# Patient Record
Sex: Female | Born: 1960 | Race: White | Hispanic: No | Marital: Married | Smoking: Former smoker
Health system: Southern US, Community
[De-identification: ages and names within clinical notes are randomized; demographics above are authoritative.]

## PROBLEM LIST (undated history)

## (undated) DIAGNOSIS — E559 Vitamin D deficiency, unspecified: Secondary | ICD-10-CM

## (undated) DIAGNOSIS — I255 Ischemic cardiomyopathy: Secondary | ICD-10-CM

## (undated) DIAGNOSIS — I2510 Atherosclerotic heart disease of native coronary artery without angina pectoris: Secondary | ICD-10-CM

## (undated) DIAGNOSIS — E785 Hyperlipidemia, unspecified: Secondary | ICD-10-CM

## (undated) DIAGNOSIS — I213 ST elevation (STEMI) myocardial infarction of unspecified site: Secondary | ICD-10-CM

## (undated) DIAGNOSIS — I10 Essential (primary) hypertension: Secondary | ICD-10-CM

## (undated) HISTORY — DX: Vitamin D deficiency, unspecified: E55.9

## (undated) HISTORY — PX: CHOLECYSTECTOMY: SHX55

## (undated) HISTORY — DX: Atherosclerotic heart disease of native coronary artery without angina pectoris: I25.10

## (undated) HISTORY — PX: APPENDECTOMY: SHX54

---
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
## 1997-12-05 ENCOUNTER — Emergency Department (HOSPITAL_COMMUNITY): Admission: EM | Admit: 1997-12-05 | Discharge: 1997-12-05 | Payer: Self-pay | Admitting: Emergency Medicine

## 1997-12-09 ENCOUNTER — Ambulatory Visit (HOSPITAL_COMMUNITY): Admission: RE | Admit: 1997-12-09 | Discharge: 1997-12-10 | Payer: Self-pay | Admitting: General Surgery

## 1998-08-04 ENCOUNTER — Other Ambulatory Visit: Admission: RE | Admit: 1998-08-04 | Discharge: 1998-08-04 | Payer: Self-pay | Admitting: Gynecology

## 1998-08-21 ENCOUNTER — Ambulatory Visit (HOSPITAL_COMMUNITY): Admission: RE | Admit: 1998-08-21 | Discharge: 1998-08-21 | Payer: Self-pay | Admitting: Gynecology

## 2002-07-30 ENCOUNTER — Emergency Department (HOSPITAL_COMMUNITY): Admission: EM | Admit: 2002-07-30 | Discharge: 2002-07-30 | Payer: Self-pay | Admitting: Emergency Medicine

## 2005-07-20 ENCOUNTER — Ambulatory Visit (HOSPITAL_COMMUNITY): Admission: RE | Admit: 2005-07-20 | Discharge: 2005-07-20 | Payer: Self-pay | Admitting: Family Medicine

## 2005-12-04 ENCOUNTER — Ambulatory Visit (HOSPITAL_COMMUNITY): Admission: AD | Admit: 2005-12-04 | Discharge: 2005-12-04 | Payer: Self-pay | Admitting: Urology

## 2005-12-12 ENCOUNTER — Ambulatory Visit (HOSPITAL_COMMUNITY): Admission: RE | Admit: 2005-12-12 | Discharge: 2005-12-12 | Payer: Self-pay | Admitting: Urology

## 2006-09-18 ENCOUNTER — Ambulatory Visit (HOSPITAL_COMMUNITY): Admission: RE | Admit: 2006-09-18 | Discharge: 2006-09-18 | Payer: Self-pay | Admitting: Urology

## 2011-07-24 ENCOUNTER — Other Ambulatory Visit: Payer: Self-pay | Admitting: *Deleted

## 2011-07-24 DIAGNOSIS — Z1231 Encounter for screening mammogram for malignant neoplasm of breast: Secondary | ICD-10-CM

## 2011-08-14 ENCOUNTER — Ambulatory Visit: Payer: Self-pay

## 2011-08-28 ENCOUNTER — Ambulatory Visit
Admission: RE | Admit: 2011-08-28 | Discharge: 2011-08-28 | Disposition: A | Discharge status: Home / Self Care | Payer: Managed Care, Other (non HMO) | Source: Ambulatory Visit | Attending: *Deleted | Admitting: *Deleted

## 2011-08-28 DIAGNOSIS — Z1231 Encounter for screening mammogram for malignant neoplasm of breast: Secondary | ICD-10-CM

## 2016-11-20 ENCOUNTER — Encounter: Payer: Self-pay | Admitting: Gastroenterology

## 2016-12-13 ENCOUNTER — Encounter: Payer: Self-pay | Admitting: *Deleted

## 2016-12-19 ENCOUNTER — Encounter: Payer: Managed Care, Other (non HMO) | Admitting: Gastroenterology

## 2019-10-28 DIAGNOSIS — I219 Acute myocardial infarction, unspecified: Secondary | ICD-10-CM

## 2019-10-28 HISTORY — DX: Acute myocardial infarction, unspecified: I21.9

## 2019-11-07 ENCOUNTER — Inpatient Hospital Stay (HOSPITAL_COMMUNITY): Payer: 59

## 2019-11-07 ENCOUNTER — Encounter (HOSPITAL_COMMUNITY)
Admission: EM | Disposition: A | Discharge status: Home / Self Care | Payer: Self-pay | Source: Home / Self Care | Attending: Cardiovascular Disease

## 2019-11-07 ENCOUNTER — Inpatient Hospital Stay (HOSPITAL_COMMUNITY)
Admission: EM | Admit: 2019-11-07 | Discharge: 2019-11-09 | DRG: 247 | Disposition: A | Discharge status: Home / Self Care | Payer: 59 | Attending: Cardiovascular Disease | Admitting: Cardiovascular Disease

## 2019-11-07 ENCOUNTER — Emergency Department (HOSPITAL_COMMUNITY): Payer: 59

## 2019-11-07 ENCOUNTER — Encounter (HOSPITAL_COMMUNITY): Payer: Self-pay | Admitting: Emergency Medicine

## 2019-11-07 DIAGNOSIS — E782 Mixed hyperlipidemia: Secondary | ICD-10-CM

## 2019-11-07 DIAGNOSIS — Z882 Allergy status to sulfonamides status: Secondary | ICD-10-CM | POA: Diagnosis not present

## 2019-11-07 DIAGNOSIS — E669 Obesity, unspecified: Secondary | ICD-10-CM | POA: Diagnosis present

## 2019-11-07 DIAGNOSIS — I252 Old myocardial infarction: Secondary | ICD-10-CM | POA: Diagnosis present

## 2019-11-07 DIAGNOSIS — E785 Hyperlipidemia, unspecified: Secondary | ICD-10-CM | POA: Diagnosis present

## 2019-11-07 DIAGNOSIS — I219 Acute myocardial infarction, unspecified: Secondary | ICD-10-CM | POA: Diagnosis not present

## 2019-11-07 DIAGNOSIS — I2109 ST elevation (STEMI) myocardial infarction involving other coronary artery of anterior wall: Secondary | ICD-10-CM

## 2019-11-07 DIAGNOSIS — Z955 Presence of coronary angioplasty implant and graft: Secondary | ICD-10-CM

## 2019-11-07 DIAGNOSIS — Z87891 Personal history of nicotine dependence: Secondary | ICD-10-CM | POA: Diagnosis not present

## 2019-11-07 DIAGNOSIS — Z20822 Contact with and (suspected) exposure to covid-19: Secondary | ICD-10-CM | POA: Diagnosis present

## 2019-11-07 DIAGNOSIS — I213 ST elevation (STEMI) myocardial infarction of unspecified site: Secondary | ICD-10-CM | POA: Diagnosis present

## 2019-11-07 DIAGNOSIS — I2510 Atherosclerotic heart disease of native coronary artery without angina pectoris: Secondary | ICD-10-CM | POA: Diagnosis present

## 2019-11-07 DIAGNOSIS — I10 Essential (primary) hypertension: Secondary | ICD-10-CM | POA: Diagnosis present

## 2019-11-07 DIAGNOSIS — Z6833 Body mass index (BMI) 33.0-33.9, adult: Secondary | ICD-10-CM

## 2019-11-07 DIAGNOSIS — Z888 Allergy status to other drugs, medicaments and biological substances status: Secondary | ICD-10-CM

## 2019-11-07 DIAGNOSIS — I2102 ST elevation (STEMI) myocardial infarction involving left anterior descending coronary artery: Secondary | ICD-10-CM | POA: Diagnosis not present

## 2019-11-07 DIAGNOSIS — I2582 Chronic total occlusion of coronary artery: Secondary | ICD-10-CM | POA: Diagnosis present

## 2019-11-07 DIAGNOSIS — I255 Ischemic cardiomyopathy: Secondary | ICD-10-CM | POA: Diagnosis present

## 2019-11-07 HISTORY — PX: LEFT HEART CATH AND CORONARY ANGIOGRAPHY: CATH118249

## 2019-11-07 HISTORY — DX: Ischemic cardiomyopathy: I25.5

## 2019-11-07 HISTORY — DX: Hyperlipidemia, unspecified: E78.5

## 2019-11-07 HISTORY — PX: CORONARY/GRAFT ACUTE MI REVASCULARIZATION: CATH118305

## 2019-11-07 HISTORY — DX: Essential (primary) hypertension: I10

## 2019-11-07 LAB — CBC WITH DIFFERENTIAL/PLATELET
Abs Immature Granulocytes: 0.02 10*3/uL (ref 0.00–0.07)
Basophils Absolute: 0.1 10*3/uL (ref 0.0–0.1)
Basophils Relative: 1 %
Eosinophils Absolute: 0.4 10*3/uL (ref 0.0–0.5)
Eosinophils Relative: 4 %
HCT: 46.4 % — ABNORMAL HIGH (ref 36.0–46.0)
Hemoglobin: 15.4 g/dL — ABNORMAL HIGH (ref 12.0–15.0)
Immature Granulocytes: 0 %
Lymphocytes Relative: 31 %
Lymphs Abs: 2.9 10*3/uL (ref 0.7–4.0)
MCH: 30.7 pg (ref 26.0–34.0)
MCHC: 33.2 g/dL (ref 30.0–36.0)
MCV: 92.6 fL (ref 80.0–100.0)
Monocytes Absolute: 0.5 10*3/uL (ref 0.1–1.0)
Monocytes Relative: 5 %
Neutro Abs: 5.5 10*3/uL (ref 1.7–7.7)
Neutrophils Relative %: 59 %
Platelets: 307 10*3/uL (ref 150–400)
RBC: 5.01 MIL/uL (ref 3.87–5.11)
RDW: 13.1 % (ref 11.5–15.5)
WBC: 9.3 10*3/uL (ref 4.0–10.5)
nRBC: 0 % (ref 0.0–0.2)

## 2019-11-07 LAB — COMPREHENSIVE METABOLIC PANEL
ALT: 27 U/L (ref 0–44)
AST: 23 U/L (ref 15–41)
Albumin: 3.8 g/dL (ref 3.5–5.0)
Alkaline Phosphatase: 89 U/L (ref 38–126)
Anion gap: 11 (ref 5–15)
BUN: 13 mg/dL (ref 6–20)
CO2: 20 mmol/L — ABNORMAL LOW (ref 22–32)
Calcium: 9.7 mg/dL (ref 8.9–10.3)
Chloride: 108 mmol/L (ref 98–111)
Creatinine, Ser: 0.75 mg/dL (ref 0.44–1.00)
GFR calc Af Amer: >60 mL/min (ref >60)
GFR calc non Af Amer: >60 mL/min (ref >60)
Glucose, Bld: 133 mg/dL — ABNORMAL HIGH (ref 70–99)
Potassium: 3.7 mmol/L (ref 3.5–5.1)
Sodium: 139 mmol/L (ref 135–145)
Total Bilirubin: 0.3 mg/dL (ref 0.3–1.2)
Total Protein: 7.1 g/dL (ref 6.5–8.1)

## 2019-11-07 LAB — I-STAT CHEM 8, ED
BUN: 15 mg/dL (ref 6–20)
Calcium, Ion: 1.17 mmol/L (ref 1.15–1.40)
Chloride: 108 mmol/L (ref 98–111)
Creatinine, Ser: 0.8 mg/dL (ref 0.44–1.00)
Glucose, Bld: 130 mg/dL — ABNORMAL HIGH (ref 70–99)
HCT: 43 % (ref 36.0–46.0)
Hemoglobin: 14.6 g/dL (ref 12.0–15.0)
Potassium: 3.5 mmol/L (ref 3.5–5.1)
Sodium: 140 mmol/L (ref 135–145)
TCO2: 23 mmol/L (ref 22–32)

## 2019-11-07 LAB — LIPID PANEL
Cholesterol: 287 mg/dL — ABNORMAL HIGH (ref 0–200)
HDL: 50 mg/dL (ref >40)
LDL Cholesterol: 201 mg/dL — ABNORMAL HIGH (ref 0–99)
Total CHOL/HDL Ratio: 5.7 RATIO
Triglycerides: 178 mg/dL — ABNORMAL HIGH (ref <150)
VLDL: 36 mg/dL (ref 0–40)

## 2019-11-07 LAB — ECHOCARDIOGRAM COMPLETE
Height: 63 in
Weight: 3185.21 oz

## 2019-11-07 LAB — RESPIRATORY PANEL BY RT PCR (FLU A&B, COVID)
Influenza A by PCR: NEGATIVE
Influenza B by PCR: NEGATIVE
SARS Coronavirus 2 by RT PCR: NEGATIVE

## 2019-11-07 LAB — PROTIME-INR
INR: 0.9 (ref 0.8–1.2)
Prothrombin Time: 12 seconds (ref 11.4–15.2)

## 2019-11-07 LAB — MRSA PCR SCREENING: MRSA by PCR: NEGATIVE

## 2019-11-07 LAB — APTT: aPTT: 28 seconds (ref 24–36)

## 2019-11-07 LAB — TROPONIN I (HIGH SENSITIVITY)
Troponin I (High Sensitivity): 157 ng/L (ref <18)
Troponin I (High Sensitivity): 12019 ng/L (ref <18)

## 2019-11-07 SURGERY — CORONARY/GRAFT ACUTE MI REVASCULARIZATION
Anesthesia: LOCAL

## 2019-11-07 MED ORDER — HEPARIN SODIUM (PORCINE) 5000 UNIT/ML IJ SOLN: 4000.0000 [IU] | Freq: Once | INTRAMUSCULAR | Status: DC

## 2019-11-07 MED ORDER — HEPARIN (PORCINE) IN NACL 1000-0.9 UT/500ML-% IV SOLN
INTRAVENOUS | Status: AC
  Filled 2019-11-07: qty 1000

## 2019-11-07 MED ORDER — SODIUM CHLORIDE 0.9 % IV SOLN: 250.0000 mL | INTRAVENOUS | Status: DC | PRN

## 2019-11-07 MED ORDER — DIAZEPAM 5 MG PO TABS: 5.0000 mg | ORAL_TABLET | Freq: Three times a day (TID) | ORAL | Status: DC | PRN

## 2019-11-07 MED ORDER — HEPARIN (PORCINE) IN NACL 1000-0.9 UT/500ML-% IV SOLN
INTRAVENOUS | Status: DC | PRN
  Administered 2019-11-07 (×2): 500 mL

## 2019-11-07 MED ORDER — ALPRAZOLAM 0.5 MG PO TABS
0.5000 mg | ORAL_TABLET | Freq: Three times a day (TID) | ORAL | Status: DC | PRN
  Administered 2019-11-07 – 2019-11-08 (×2): 0.5 mg via ORAL
  Filled 2019-11-07 (×2): qty 1

## 2019-11-07 MED ORDER — NITROGLYCERIN IN D5W 200-5 MCG/ML-% IV SOLN
INTRAVENOUS | Status: AC
  Administered 2019-11-07: 50 mg
  Filled 2019-11-07: qty 250

## 2019-11-07 MED ORDER — MIDAZOLAM HCL 2 MG/2ML IJ SOLN
INTRAMUSCULAR | Status: DC | PRN
  Administered 2019-11-07: 2 mg via INTRAVENOUS
  Administered 2019-11-07: 1 mg via INTRAVENOUS

## 2019-11-07 MED ORDER — FENTANYL CITRATE (PF) 100 MCG/2ML IJ SOLN
INTRAMUSCULAR | Status: AC
  Filled 2019-11-07: qty 2

## 2019-11-07 MED ORDER — ONDANSETRON HCL 4 MG/2ML IJ SOLN
4.0000 mg | Freq: Four times a day (QID) | INTRAMUSCULAR | Status: DC | PRN
  Administered 2019-11-07 (×2): 4 mg via INTRAVENOUS
  Filled 2019-11-07: qty 2

## 2019-11-07 MED ORDER — ONDANSETRON HCL 4 MG/2ML IJ SOLN
4.0000 mg | Freq: Four times a day (QID) | INTRAMUSCULAR | Status: DC | PRN
  Filled 2019-11-07: qty 2

## 2019-11-07 MED ORDER — ATORVASTATIN CALCIUM 80 MG PO TABS
80.0000 mg | ORAL_TABLET | Freq: Every day | ORAL | Status: DC
  Administered 2019-11-07 – 2019-11-09 (×3): 80 mg via ORAL
  Filled 2019-11-07 (×4): qty 1

## 2019-11-07 MED ORDER — SODIUM CHLORIDE 0.9 % IV SOLN: INTRAVENOUS | Status: DC

## 2019-11-07 MED ORDER — SODIUM CHLORIDE 0.9% FLUSH: 3.0000 mL | INTRAVENOUS | Status: DC | PRN

## 2019-11-07 MED ORDER — NITROGLYCERIN IN D5W 200-5 MCG/ML-% IV SOLN: 0.0000 ug/min | INTRAVENOUS | Status: DC

## 2019-11-07 MED ORDER — TICAGRELOR 90 MG PO TABS
ORAL_TABLET | ORAL | Status: AC
  Filled 2019-11-07: qty 2

## 2019-11-07 MED ORDER — NITROGLYCERIN 1 MG/10 ML FOR IR/CATH LAB
INTRA_ARTERIAL | Status: DC | PRN
  Administered 2019-11-07 (×2): 200 ug via INTRACORONARY

## 2019-11-07 MED ORDER — NITROGLYCERIN 0.4 MG SL SUBL
0.4000 mg | SUBLINGUAL_TABLET | SUBLINGUAL | Status: DC | PRN
  Filled 2019-11-07: qty 1

## 2019-11-07 MED ORDER — HEPARIN SODIUM (PORCINE) 1000 UNIT/ML IJ SOLN
INTRAMUSCULAR | Status: DC | PRN
  Administered 2019-11-07 (×2): 4000 [IU] via INTRAVENOUS

## 2019-11-07 MED ORDER — CHLORHEXIDINE GLUCONATE CLOTH 2 % EX PADS
6.0000 | MEDICATED_PAD | Freq: Every day | CUTANEOUS | Status: DC
  Administered 2019-11-07 – 2019-11-08 (×2): 6 via TOPICAL

## 2019-11-07 MED ORDER — FUROSEMIDE 10 MG/ML IJ SOLN
40.0000 mg | Freq: Once | INTRAMUSCULAR | Status: AC
  Administered 2019-11-07: 40 mg via INTRAVENOUS
  Filled 2019-11-07: qty 4

## 2019-11-07 MED ORDER — HEPARIN SODIUM (PORCINE) 1000 UNIT/ML IJ SOLN
INTRAMUSCULAR | Status: AC
  Filled 2019-11-07: qty 1

## 2019-11-07 MED ORDER — LOSARTAN POTASSIUM 50 MG PO TABS
50.0000 mg | ORAL_TABLET | Freq: Every day | ORAL | Status: DC
  Administered 2019-11-07: 50 mg via ORAL
  Filled 2019-11-07 (×2): qty 1

## 2019-11-07 MED ORDER — FUROSEMIDE 40 MG PO TABS
40.0000 mg | ORAL_TABLET | Freq: Every day | ORAL | Status: DC
  Filled 2019-11-07: qty 1

## 2019-11-07 MED ORDER — FENTANYL CITRATE (PF) 100 MCG/2ML IJ SOLN
INTRAMUSCULAR | Status: DC | PRN
  Administered 2019-11-07: 50 ug via INTRAVENOUS
  Administered 2019-11-07 (×2): 25 ug via INTRAVENOUS

## 2019-11-07 MED ORDER — ASPIRIN EC 81 MG PO TBEC
81.0000 mg | DELAYED_RELEASE_TABLET | Freq: Every day | ORAL | Status: DC
  Administered 2019-11-08 – 2019-11-09 (×2): 81 mg via ORAL
  Filled 2019-11-07 (×4): qty 1

## 2019-11-07 MED ORDER — TICAGRELOR 90 MG PO TABS
ORAL_TABLET | ORAL | Status: DC | PRN
  Administered 2019-11-07: 180 mg via ORAL

## 2019-11-07 MED ORDER — FENTANYL CITRATE (PF) 100 MCG/2ML IJ SOLN: 25.0000 ug | INTRAMUSCULAR | Status: DC | PRN

## 2019-11-07 MED ORDER — VERAPAMIL HCL 2.5 MG/ML IV SOLN
INTRAVENOUS | Status: DC | PRN
  Administered 2019-11-07: 05:00:00 10 mL via INTRA_ARTERIAL

## 2019-11-07 MED ORDER — NITROGLYCERIN 1 MG/10 ML FOR IR/CATH LAB
INTRA_ARTERIAL | Status: AC
  Filled 2019-11-07: qty 10

## 2019-11-07 MED ORDER — ACETAMINOPHEN 325 MG PO TABS
650.0000 mg | ORAL_TABLET | ORAL | Status: DC | PRN
  Administered 2019-11-07 – 2019-11-08 (×2): 650 mg via ORAL
  Filled 2019-11-07 (×2): qty 2

## 2019-11-07 MED ORDER — LIDOCAINE HCL (PF) 1 % IJ SOLN
INTRAMUSCULAR | Status: AC
  Filled 2019-11-07: qty 30

## 2019-11-07 MED ORDER — ASPIRIN 81 MG PO CHEW: 324.0000 mg | CHEWABLE_TABLET | Freq: Once | ORAL | Status: DC

## 2019-11-07 MED ORDER — SODIUM CHLORIDE 0.9% FLUSH
3.0000 mL | Freq: Two times a day (BID) | INTRAVENOUS | Status: DC
  Administered 2019-11-08 – 2019-11-09 (×3): 3 mL via INTRAVENOUS

## 2019-11-07 MED ORDER — VERAPAMIL HCL 2.5 MG/ML IV SOLN
INTRAVENOUS | Status: AC
  Filled 2019-11-07: qty 2

## 2019-11-07 MED ORDER — ENOXAPARIN SODIUM 40 MG/0.4ML ~~LOC~~ SOLN
40.0000 mg | SUBCUTANEOUS | Status: DC
  Administered 2019-11-07 – 2019-11-09 (×3): 40 mg via SUBCUTANEOUS
  Filled 2019-11-07 (×3): qty 0.4

## 2019-11-07 MED ORDER — LIDOCAINE HCL (PF) 1 % IJ SOLN
INTRAMUSCULAR | Status: DC | PRN
  Administered 2019-11-07: 2 mL via INTRADERMAL

## 2019-11-07 MED ORDER — IOHEXOL 350 MG/ML SOLN
INTRAVENOUS | Status: DC | PRN
  Administered 2019-11-07: 135 mL

## 2019-11-07 MED ORDER — TICAGRELOR 90 MG PO TABS
90.0000 mg | ORAL_TABLET | Freq: Two times a day (BID) | ORAL | Status: DC
  Administered 2019-11-07 – 2019-11-09 (×4): 90 mg via ORAL
  Filled 2019-11-07 (×4): qty 1

## 2019-11-07 MED ORDER — MIDAZOLAM HCL 2 MG/2ML IJ SOLN
INTRAMUSCULAR | Status: AC
  Filled 2019-11-07: qty 2

## 2019-11-07 MED ORDER — LABETALOL HCL 5 MG/ML IV SOLN: 10.0000 mg | INTRAVENOUS | Status: AC | PRN

## 2019-11-07 MED ORDER — SODIUM CHLORIDE 0.9 % WEIGHT BASED INFUSION
1.0000 mL/kg/h | INTRAVENOUS | Status: AC
  Administered 2019-11-07: 1 mL/kg/h via INTRAVENOUS

## 2019-11-07 MED ORDER — METOPROLOL TARTRATE 12.5 MG HALF TABLET
12.5000 mg | ORAL_TABLET | Freq: Two times a day (BID) | ORAL | Status: DC
  Administered 2019-11-07 (×2): 12.5 mg via ORAL
  Filled 2019-11-07 (×3): qty 1

## 2019-11-07 MED ORDER — HYDRALAZINE HCL 20 MG/ML IJ SOLN: 10.0000 mg | INTRAMUSCULAR | Status: AC | PRN

## 2019-11-07 SURGICAL SUPPLY — 17 items
BALLN SAPPHIRE 2.0X15 (BALLOONS) ×2
BALLN SAPPHIRE ~~LOC~~ 2.75X18 (BALLOONS) ×1 IMPLANT
BALLOON SAPPHIRE 2.0X15 (BALLOONS) IMPLANT
CATH 5FR JL3.5 JR4 ANG PIG MP (CATHETERS) ×1 IMPLANT
CATH LAUNCHER 5F EBU3.0 (CATHETERS) IMPLANT
CATHETER LAUNCHER 5F EBU3.0 (CATHETERS) ×2
DEVICE RAD COMP TR BAND LRG (VASCULAR PRODUCTS) ×1 IMPLANT
GLIDESHEATH SLEND SS 6F .021 (SHEATH) ×2 IMPLANT
GUIDEWIRE INQWIRE 1.5J.035X260 (WIRE) ×1 IMPLANT
INQWIRE 1.5J .035X260CM (WIRE) ×2
KIT ENCORE 26 ADVANTAGE (KITS) ×2 IMPLANT
KIT HEART LEFT (KITS) ×2 IMPLANT
PACK CARDIAC CATHETERIZATION (CUSTOM PROCEDURE TRAY) ×2 IMPLANT
STENT RESOLUTE ONYX 2.75X34 (Permanent Stent) ×1 IMPLANT
TRANSDUCER W/STOPCOCK (MISCELLANEOUS) ×2 IMPLANT
TUBING CIL FLEX 10 FLL-RA (TUBING) ×2 IMPLANT
WIRE COUGAR XT STRL 190CM (WIRE) ×1 IMPLANT

## 2019-11-07 NOTE — Progress Notes (Signed)
Progress Note  Patient Name: Rebecca Sanford Date of Encounter: 11/07/2019  Primary Cardiologist: New, Lenka Zhao   Subjective   Rebecca Sanford is a 59 year old female who presented with an acute anterior wall ST segment elevation myocardial infarction yesterday.  She was also found to have an occluded mid right coronary artery with collateral filling from left to right collaterals.  She is status post successful stenting of her mid LAD.  This morning she is continued to have some of that deep epigastric pain. She has a history of hyperlipidemia.  Her labs this morning reveal a total cholesterol of 287.  Her LDL is 201.  The HDL is 50.  The triglyceride level is 178.  She is very emotional over the recent  death of her mother .  She was cleaning out her mothers house when the CP started.   Inpatient Medications    Scheduled Meds:  [START ON 11/08/2019] aspirin EC  81 mg Oral Daily   atorvastatin  80 mg Oral q1800   metoprolol tartrate  12.5 mg Oral BID   sodium chloride flush  3 mL Intravenous Q12H   ticagrelor  90 mg Oral BID   Continuous Infusions:  sodium chloride     sodium chloride     sodium chloride 1 mL/kg/hr (11/07/19 0649)   PRN Meds: sodium chloride, acetaminophen, diazepam, fentaNYL (SUBLIMAZE) injection, hydrALAZINE, labetalol, nitroGLYCERIN, ondansetron (ZOFRAN) IV, ondansetron (ZOFRAN) IV, sodium chloride flush   Vital Signs    Vitals:   11/07/19 0538 11/07/19 0543 11/07/19 0615 11/07/19 0630  BP: 131/89 (!) 136/93 122/86 129/86  Pulse: 95 80 83 77  Resp: 20 20 (!) 24 20  Temp:   97.8 F (36.6 C)   TempSrc:   Oral   SpO2: (!) 89% 96% 93% 96%  Weight:   90.3 kg   Height:   5\' 3"  (1.6 m)    No intake or output data in the 24 hours ending 11/07/19 0923 Last 3 Weights 11/07/2019 11/07/2019  Weight (lbs) 199 lb 1.2 oz 190 lb 4.1 oz  Weight (kg) 90.3 kg 86.3 kg      Telemetry    NSR  - Personally Reviewed  ECG     - Personally Reviewed  Physical  Exam   GEN:  Middle-aged female, moderately obese, no acute distress Neck: No JVD Cardiac: RRR, no murmurs, rubs, or gallops.  Respiratory: Clear to auscultation bilaterally. GI: Soft, nontender, non-distended  MS: No edema; No deformity.  Right radial cath site looks good. Neuro:  Nonfocal  Psych: Normal affect   Labs    High Sensitivity Troponin:   Recent Labs  Lab 11/07/19 0412 11/07/19 0623  TROPONINIHS 157* 12,019*      Chemistry Recent Labs  Lab 11/07/19 0412 11/07/19 0423  NA 139 140  K 3.7 3.5  CL 108 108  CO2 20*  --   GLUCOSE 133* 130*  BUN 13 15  CREATININE 0.75 0.80  CALCIUM 9.7  --   PROT 7.1  --   ALBUMIN 3.8  --   AST 23  --   ALT 27  --   ALKPHOS 89  --   BILITOT 0.3  --   GFRNONAA >60  --   GFRAA >60  --   ANIONGAP 11  --      Hematology Recent Labs  Lab 11/07/19 0412 11/07/19 0423  WBC 9.3  --   RBC 5.01  --   HGB 15.4* 14.6  HCT 46.4* 43.0  MCV  92.6  --   MCH 30.7  --   MCHC 33.2  --   RDW 13.1  --   PLT 307  --     BNPNo results for input(s): BNP, PROBNP in the last 168 hours.   DDimer No results for input(s): DDIMER in the last 168 hours.   Radiology    CARDIAC CATHETERIZATION  Result Date: 11/07/2019 1.  Severe mid LAD stenosis, tandem lesions treated with a single 2.75 x 34 mm resolute Onyx DES 2.  Moderate left circumflex stenosis, does not appear flow-limiting 3.  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  Severe segmental LV dysfunction with akinesis of the anterolateral wall and entire periapical region including the inferoapex, LVEF estimated at 30 to 35% Recommendations: Guideline directed medical therapy, dual antiplatelet therapy with aspirin and ticagrelor at least 12 months without interruption.  The patient's left ventriculogram could be consistent with acute Takotsubo syndrome as well.  However, with the patient's EKG changes and critical stenosis of the LAD, favor STEMI involving the LAD.   We will trend cardiac markers.  DG Chest Port 1 View  Result Date: 11/07/2019 CLINICAL DATA:  Reflux.  Chest pain EXAM: PORTABLE CHEST 1 VIEW COMPARISON:  None. FINDINGS: Normal cardiac silhouette. Mild linear atelectasis lung bases. No effusion, infiltrate pneumothorax. No acute osseous abnormality. IMPRESSION: Mild basilar atelectasis. Electronically Signed   By: Suzy Bouchard M.D.   On: 11/07/2019 04:45    Cardiac Studies     Patient Profile     59 y.o. female with hyperlipidemia and coronary artery disease.  She presented with an anterior wall myocardial infarction.  Assessment & Plan    1.  Acute anterior wall myocardial infarction: She presented with a critical stenosis of the mid LAD.  She is status post stenting of her LAD.  The heart catheterization revealed that she is also had an occlusion of her mid RCA.  She has good collateral filling of the distal RCA from the left circumflex and LAD via collaterals. We will work on risk factor modification including cholesterol reduction, improvement of her diet and starting an exercise program.  She still having some chest pain today.  She was scheduled to transfer out of the intensive care unit but I think she needs to stay in the unit for today.  2.  Hyperlipidemia: Continue atorvastatin 80 mg a day.   For questions or updates, please contact [place] Please consult [email or web address] for contact info under        Signed, Mertie Moores, MD  11/07/2019, 9:23 AM

## 2019-11-07 NOTE — ED Notes (Signed)
Cards at bedside

## 2019-11-07 NOTE — Progress Notes (Signed)
C/o chest discomfort.  Mid epigastric area radiating to back.  VSS.  EKG complete-no changes noted.  No c/o n/v, diaphoresis.

## 2019-11-07 NOTE — Progress Notes (Signed)
Pt called to room d/t emesis.  C/o same epigastric discomfort.  No ekg changes noted on bedside monitor.  VSS.  Dr. Skeet Latch paged with immediate return back call.  Dr. Oval Linsey stated Dr. Cathie Olden will be to pts bedside to assess.   D/w Dr. Acie Fredrickson re: pt's events.  Orders received.  Troponins fpr 0400 and 0623 reviewed with MDs.  Will monitor closely.

## 2019-11-07 NOTE — H&P (Signed)
Cardiology History & Physical    Patient ID: CASSITY DEPOLO MRN: [id number]; DOB: 08/27/1960   Admission date: 11/07/2019  Primary Care Provider: No primary care provider on file. Primary Cardiologist: No primary care provider on file.  Primary Electrophysiologist:  None   Chief Complaint:  Chest pain  Patient Profile:   Rebecca Sanford is a 59 y.o. female with a history of HTN, HLD, and prior tobacco use who presents with chest pain.  History of Present Illness:   Had acute onset chest pain at about 5 pm.  Felt like her heartburn but worse.  Had intermittent episodes throughout the night.  No other associated symptoms.  Eventually decided to call EMS.  Initial 12 lead ECG with mild ST elevation V1-V3.  Continued to have pain on arrival to ED.  ECG with some improvement in ST elevation.  Labs with normal CBC, BMP.  Cath lab activated for STEMI.   Heart Pathway Score:      Past Medical History:  Diagnosis Date  . Hyperlipidemia   . Hypertension     Past Surgical History:  Procedure Laterality Date  . APPENDECTOMY       Medications Prior to Admission: Prior to Admission medications   Not on File     Allergies:    Allergies  Allergen Reactions  . Codeine Nausea And Vomiting  . Hydrocodone Nausea And Vomiting  . Nitrofurantoin Macrocrystal Hives  . Sulfa Antibiotics Hives    Social History:   Social History   Socioeconomic History  . Marital status: Single    Spouse name: Not on file  . Number of children: Not on file  . Years of education: Not on file  . Highest education level: Not on file  Occupational History  . Not on file  Tobacco Use  . Smoking status: Not on file  Substance and Sexual Activity  . Alcohol use: Not on file  . Drug use: Not on file  . Sexual activity: Not on file  Other Topics Concern  . Not on file  Social History Narrative  . Not on file   Social Determinants of Health   Financial Resource Strain:   . Difficulty  of Paying Living Expenses:   Food Insecurity:   . Worried About Charity fundraiser in the Last Year:   . Arboriculturist in the Last Year:   Transportation Needs:   . Film/video editor (Medical):   Marland Kitchen Lack of Transportation (Non-Medical):   Physical Activity:   . Days of Exercise per Week:   . Minutes of Exercise per Session:   Stress:   . Feeling of Stress :   Social Connections:   . Frequency of Communication with Friends and Family:   . Frequency of Social Gatherings with Friends and Family:   . Attends Religious Services:   . Active Member of Clubs or Organizations:   . Attends Archivist Meetings:   Marland Kitchen Marital Status:   Intimate Partner Violence:   . Fear of Current or Ex-Partner:   . Emotionally Abused:   Marland Kitchen Physically Abused:   . Sexually Abused:      Family History:   The patient's family history is not on file.    ROS:  Please see the history of present illness.  All other ROS reviewed and negative.     Physical Exam/Data:   Vitals:   11/07/19 0426 11/07/19 0430 11/07/19 0449 11/07/19 0459  BP:  (!) 134/91  Pulse:  75    Resp:  (!) 26    Temp:      TempSrc:      SpO2:  99% 99% (!) 88%  Weight: 86.3 kg     Height: 5\' 3"  (1.6 m)      No intake or output data in the 24 hours ending 11/07/19 0500 Last 3 Weights 11/07/2019  Weight (lbs) 190 lb 4.1 oz  Weight (kg) 86.3 kg     Body mass index is 33.7 kg/m.  Wt Readings from Last 3 Encounters:  11/07/19 86.3 kg    Physical Exam: General: Well developed, well nourished, in no acute distress. Head: Normocephalic, atraumatic, sclera non-icteric, no xanthomas, nares are without discharge.  Neck: Negative for carotid bruits. JVD not elevated. Lungs: Clear bilaterally to auscultation without wheezes, rales, or rhonchi. Breathing is unlabored. Heart: RRR with S1 S2. No murmurs, rubs, or gallops appreciated. Abdomen: Soft, non-tender, non-distended with normoactive bowel sounds. No hepatomegaly.  No rebound/guarding. No obvious abdominal masses. Msk:  Strength and tone appear normal for age. Extremities: No clubbing or cyanosis. No edema.  Distal pedal pulses are 2+ and equal bilaterally. Neuro: Alert and oriented X 3. No focal deficit. No facial asymmetry. Moves all extremities spontaneously. Psych:  Responds to questions appropriately with a normal affect.    EKG:  Sinus rhythm, ST elevation V1-V3  Relevant CV Studies: None  Laboratory Data:  High Sensitivity Troponin:  No results for input(s): TROPONINIHS in the last 720 hours.    Cardiac EnzymesNo results for input(s): TROPONINI in the last 168 hours. No results for input(s): TROPIPOC in the last 168 hours.  Chemistry Recent Labs  Lab 11/07/19 0423  NA 140  K 3.5  CL 108  GLUCOSE 130*  BUN 15  CREATININE 0.80    No results for input(s): PROT, ALBUMIN, AST, ALT, ALKPHOS, BILITOT in the last 168 hours. Hematology Recent Labs  Lab 11/07/19 0412 11/07/19 0423  WBC 9.3  --   RBC 5.01  --   HGB 15.4* 14.6  HCT 46.4* 43.0  MCV 92.6  --   MCH 30.7  --   MCHC 33.2  --   RDW 13.1  --   PLT 307  --    BNPNo results for input(s): BNP, PROBNP in the last 168 hours.  DDimer No results for input(s): DDIMER in the last 168 hours.   Radiology/Studies:  DG Chest Port 1 View  Result Date: 11/07/2019 CLINICAL DATA:  Reflux.  Chest pain EXAM: PORTABLE CHEST 1 VIEW COMPARISON:  None. FINDINGS: Normal cardiac silhouette. Mild linear atelectasis lung bases. No effusion, infiltrate pneumothorax. No acute osseous abnormality. IMPRESSION: Mild basilar atelectasis. Electronically Signed   By: Suzy Bouchard M.D.   On: 11/07/2019 04:45    Assessment and Plan   1.STEMI Presentation consistent with STEMI.  Note to be updated with LHC results and final plan.  -- standard post STEMI care, including Asprin, statin, beta blocker -- ticagrelor -- echocardiogram  2. HTN BP currently 130s/90s.  Will address with medications  prior to discharge.   Severity of Illness: The appropriate patient status for this patient is INPATIENT. Inpatient status is judged to be reasonable and necessary in order to provide the required intensity of service to ensure the patient's safety. The patient's presenting symptoms, physical exam findings, and initial radiographic and laboratory data in the context of their chronic comorbidities is felt to place them at high risk for further clinical deterioration. Furthermore, it is not anticipated  that the patient will be medically stable for discharge from the hospital within 2 midnights of admission. The following factors support the patient status of inpatient.   " The patient's presenting symptoms include chest pain. " The worrisome physical exam findings include none. " The initial radiographic and laboratory data are worrisome because of STEMI. " The chronic co-morbidities include HTN.   * I certify that at the point of admission it is my clinical judgment that the patient will require inpatient hospital care spanning beyond 2 midnights from the point of admission due to high intensity of service, high risk for further deterioration and high frequency of surveillance required.*    For questions or updates, please contact [place] Please consult [email or web address] for contact info under      Signed, Shawntelle Ungar S, MD 11/07/2019, 5:00 AM

## 2019-11-07 NOTE — Progress Notes (Signed)
    Was called by the nurse because of some ongoing chest pain.  The chest pain is clearly different than her presenting pain last night.  It is a dull ache that is not relieved with nitroglycerin.  Her troponins have increased but this is not surprising at all.  EKG does not show any significant changes.  There is a slight pleuritic component to the chest pain.  On exam I did not hear a friction rub.  She does have bilateral rhonchi and some wheezing.  We will start her on IV nitroglycerin.  We will also give her Lasix 40 mg IV.  We have started IV nitroglycerin just to cover any aspect of ischemia.  I reviewed the coronary angiograms and there is no evidence of distal embolization.  The echocardiogram shows a large anteroapical area of akinesis. I suspect that the myocardial infarction had going on for a bit longer than what she originally thought.  Will add Losartan 50 mg a day  If she tolerates this, consider changing to entresto if bp allows.  Lasix 40 mg IV today, then 40 mg po starting tomorrow    We may need to consider repeating the echocardiogram later this week to ensure that she does not develop an apical thrombus.

## 2019-11-07 NOTE — ED Triage Notes (Addendum)
Pt c/o gerd onset 65 yesterday with central CP starting @2am  with radiation to back and elbows. STEMI activated by EMS, #18 L AC, #20 L hand. ASA 324mg , Nitro x 2, Morphine 4mg  given PTA. Emesis x 1, Zofran 4mg  given by EMS

## 2019-11-07 NOTE — Progress Notes (Signed)
  Echocardiogram 2D Echocardiogram has been performed.  Rebecca Sanford 11/07/2019, 10:22 AM

## 2019-11-07 NOTE — ED Provider Notes (Signed)
[facility] EMERGENCY DEPARTMENT Provider Note   CSN: [id number] Arrival date & time: 11/07/19  0408     History Chief Complaint  Patient presents with  . Chest Pain    Rebecca Sanford is a 59 y.o. female.  Level 5 caveat for acuity of condition.  Patient brought in by EMS as code STEMI.  States she developed "heartburn" about 5 PM yesterday and chest pain has been intermittent all evening but became severe around 2 AM.  She describes pain and pressure in the center of her chest that radiates to her upper back and bilateral elbows.  Associated with some shortness of breath and nausea.  No vomiting or diaphoresis.  She has never had this pain in the past.  Denies any cardiac history, does not take any regular medications.  Denies any history of hypertension, diabetes or high cholesterol.  She is a former smoker.  Her sister has 4 stents.  EMS activated STEMI based on ST elevation anteriorly. On arrival to the ED her ST elevation has improved.  She is still having 6 out of 10 pain.  The history is provided by the patient, the EMS personnel and a relative.  Chest Pain Associated symptoms: back pain, nausea and shortness of breath   Associated symptoms: no abdominal pain, no dizziness, no fever, no headache, no vomiting and no weakness        No past medical history on file.  There are no problems to display for this patient.   Past Surgical History:  Procedure Laterality Date  . APPENDECTOMY       OB History   No obstetric history on file.     No family history on file.  Social History   Tobacco Use  . Smoking status: Not on file  Substance Use Topics  . Alcohol use: Not on file  . Drug use: Not on file    Home Medications Prior to Admission medications   Not on File    Allergies    Patient has no allergy information on record.  Review of Systems   Review of Systems  Constitutional: Negative for activity change, appetite change and  fever.  HENT: Negative for congestion and rhinorrhea.   Respiratory: Positive for chest tightness and shortness of breath.   Cardiovascular: Positive for chest pain.  Gastrointestinal: Positive for nausea. Negative for abdominal pain and vomiting.  Genitourinary: Negative for dysuria and hematuria.  Musculoskeletal: Positive for back pain.  Skin: Negative for rash.  Neurological: Negative for dizziness, weakness and headaches.   all other systems are negative except as noted in the HPI and PMH.    Physical Exam Updated Vital Signs BP (!) 136/93   Pulse 80   Temp 97.9 F (36.6 C) (Oral)   Resp 20   Ht 5\' 3"  (1.6 m)   Wt 86.3 kg   SpO2 96%   BMI 33.70 kg/m   Physical Exam Vitals and nursing note reviewed.  Constitutional:      General: She is in acute distress.     Appearance: She is well-developed.     Comments: Uncomfortable complaining of chest and back pain  HENT:     Head: Normocephalic and atraumatic.     Mouth/Throat:     Pharynx: No oropharyngeal exudate.  Eyes:     Conjunctiva/sclera: Conjunctivae normal.     Pupils: Pupils are equal, round, and reactive to light.  Neck:     Comments: No meningismus. Cardiovascular:  Rate and Rhythm: Normal rate and regular rhythm.     Heart sounds: Normal heart sounds. No murmur.     Comments: Equal grip strength and radial pulse Pulmonary:     Effort: Pulmonary effort is normal. No respiratory distress.     Breath sounds: Normal breath sounds.     Comments: Equal breath sounds Chest:     Chest wall: No tenderness.  Abdominal:     Palpations: Abdomen is soft.     Tenderness: There is no abdominal tenderness. There is no guarding or rebound.  Musculoskeletal:        General: No tenderness. Normal range of motion.     Cervical back: Normal range of motion and neck supple.     Comments: Equal DP pulses  Skin:    General: Skin is warm.  Neurological:     Mental Status: She is alert and oriented to person, place, and  time.     Cranial Nerves: No cranial nerve deficit.     Motor: No abnormal muscle tone.     Coordination: Coordination normal.     Comments:  5/5 strength throughout. CN 2-12 intact.Equal grip strength.   Psychiatric:        Behavior: Behavior normal.     ED Results / Procedures / Treatments   Labs (all labs ordered are listed, but only abnormal results are displayed) Labs Reviewed  CBC WITH DIFFERENTIAL/PLATELET - Abnormal; Notable for the following components:      Result Value   Hemoglobin 15.4 (*)    HCT 46.4 (*)    All other components within normal limits  COMPREHENSIVE METABOLIC PANEL - Abnormal; Notable for the following components:   CO2 20 (*)    Glucose, Bld 133 (*)    All other components within normal limits  LIPID PANEL - Abnormal; Notable for the following components:   Cholesterol 287 (*)    Triglycerides 178 (*)    LDL Cholesterol 201 (*)    All other components within normal limits  I-STAT CHEM 8, ED - Abnormal; Notable for the following components:   Glucose, Bld 130 (*)    All other components within normal limits  TROPONIN I (HIGH SENSITIVITY) - Abnormal; Notable for the following components:   Troponin I (High Sensitivity) 157 (*)    All other components within normal limits  RESPIRATORY PANEL BY RT PCR (FLU A&B, COVID)  MRSA PCR SCREENING  PROTIME-INR  APTT  HEMOGLOBIN A1C  TROPONIN I (HIGH SENSITIVITY)    EKG EKG Interpretation  Date/Time:  Sunday November 07 2019 04:12:44 EDT Ventricular Rate:  85 PR Interval:    QRS Duration: 85 QT Interval:  383 QTC Calculation: 456 R Axis:   17 Text Interpretation: Sinus rhythm Low voltage, precordial leads RSR' in V1 or V2, right VCD or RVH ST elevation consider anterior injury or acute infarct anterior ST elevation has improved from EMS EKG No previous ECGs available Confirmed by Ezequiel Essex [phone]) on 11/07/2019 4:29:44 AM   Radiology CARDIAC CATHETERIZATION  Result Date: 11/07/2019 1.  Severe  mid LAD stenosis, tandem lesions treated with a single 2.75 x 34 mm resolute Onyx DES 2.  Moderate left circumflex stenosis, does not appear flow-limiting 3.  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  Severe segmental LV dysfunction with akinesis of the anterolateral wall and entire periapical region including the inferoapex, LVEF estimated at 30 to 35% Recommendations: Guideline directed medical therapy, dual antiplatelet therapy with aspirin and ticagrelor at  least 12 months without interruption.  The patient's left ventriculogram could be consistent with acute Takotsubo syndrome as well.  However, with the patient's EKG changes and critical stenosis of the LAD, favor STEMI involving the LAD.  We will trend cardiac markers.  DG Chest Port 1 View  Result Date: 11/07/2019 CLINICAL DATA:  Reflux.  Chest pain EXAM: PORTABLE CHEST 1 VIEW COMPARISON:  None. FINDINGS: Normal cardiac silhouette. Mild linear atelectasis lung bases. No effusion, infiltrate pneumothorax. No acute osseous abnormality. IMPRESSION: Mild basilar atelectasis. Electronically Signed   By: Suzy Bouchard M.D.   On: 11/07/2019 04:45    Procedures .Critical Care Performed by: Ezequiel Essex, MD Authorized by: Ezequiel Essex, MD   Critical care provider statement:    Critical care time (minutes):  35   Critical care was necessary to treat or prevent imminent or life-threatening deterioration of the following conditions: STEMI.   Critical care was time spent personally by me on the following activities:  Discussions with consultants, evaluation of patient's response to treatment, examination of patient, ordering and performing treatments and interventions, ordering and review of laboratory studies, ordering and review of radiographic studies, pulse oximetry, re-evaluation of patient's condition, obtaining history from patient or surrogate and review of old charts   (including critical care  time)  Medications Ordered in ED Medications  0.9 %  sodium chloride infusion (has no administration in time range)  aspirin chewable tablet 324 mg (has no administration in time range)  heparin injection 4,000 Units (has no administration in time range)    ED Course  I have reviewed the triage vital signs and the nursing notes.  Pertinent labs & imaging results that were available during my care of the patient were reviewed by me and considered in my medical decision making (see chart for details).    MDM Rules/Calculators/A&P                     Chest pain with ST elevation anteriorly by EMS.  ST segments have improved on arrival to the ED.  Patient received aspirin, nitroglycerin, heparin on arrival.  Seen at bedside with Dr. Charissa Bash of cardiology.  Patient still having pain though her ST segments have improved from EMS EKG. She is complaining of pain rating to her back and elbows.  Equal upper extremity blood pressures, grip strength and pulses.  Pain improving after receiving heparin, nitroglycerin and morphine.  Maintaining blood pressure mental status throughout ED course.  Patient taken emergently to the catheterization lab with Dr. Burt Knack. Final Clinical Impression(s) / ED Diagnoses Final diagnoses:  ST elevation myocardial infarction involving left anterior descending (LAD) coronary artery [facility])    Rx / DC Orders ED Discharge Orders    None       Brandan Glauber, Annie Main, MD 11/07/19 [phone]

## 2019-11-08 ENCOUNTER — Other Ambulatory Visit: Payer: Self-pay

## 2019-11-08 LAB — BASIC METABOLIC PANEL
Anion gap: 12 (ref 5–15)
BUN: 7 mg/dL (ref 6–20)
CO2: 22 mmol/L (ref 22–32)
Calcium: 9.4 mg/dL (ref 8.9–10.3)
Chloride: 105 mmol/L (ref 98–111)
Creatinine, Ser: 0.76 mg/dL (ref 0.44–1.00)
GFR calc Af Amer: >60 mL/min (ref >60)
GFR calc non Af Amer: >60 mL/min (ref >60)
Glucose, Bld: 99 mg/dL (ref 70–99)
Potassium: 4 mmol/L (ref 3.5–5.1)
Sodium: 139 mmol/L (ref 135–145)

## 2019-11-08 LAB — CBC
HCT: 46.6 % — ABNORMAL HIGH (ref 36.0–46.0)
Hemoglobin: 15.5 g/dL — ABNORMAL HIGH (ref 12.0–15.0)
MCH: 30.4 pg (ref 26.0–34.0)
MCHC: 33.3 g/dL (ref 30.0–36.0)
MCV: 91.4 fL (ref 80.0–100.0)
Platelets: 281 10*3/uL (ref 150–400)
RBC: 5.1 MIL/uL (ref 3.87–5.11)
RDW: 12.9 % (ref 11.5–15.5)
WBC: 9.9 10*3/uL (ref 4.0–10.5)
nRBC: 0 % (ref 0.0–0.2)

## 2019-11-08 LAB — POCT ACTIVATED CLOTTING TIME
Activated Clotting Time: 219 seconds
Activated Clotting Time: 301 seconds

## 2019-11-08 LAB — HEMOGLOBIN A1C
Hgb A1c MFr Bld: 5.3 % (ref 4.8–5.6)
Mean Plasma Glucose: 105 mg/dL

## 2019-11-08 MED ORDER — SALINE SPRAY 0.65 % NA SOLN
1.0000 | NASAL | Status: DC | PRN
  Filled 2019-11-08: qty 44

## 2019-11-08 MED ORDER — FUROSEMIDE 20 MG PO TABS
20.0000 mg | ORAL_TABLET | Freq: Every day | ORAL | Status: DC
  Administered 2019-11-08 – 2019-11-09 (×2): 20 mg via ORAL
  Filled 2019-11-08 (×2): qty 1

## 2019-11-08 MED ORDER — LOSARTAN POTASSIUM 25 MG PO TABS
25.0000 mg | ORAL_TABLET | Freq: Every day | ORAL | Status: DC
  Administered 2019-11-08 – 2019-11-09 (×2): 25 mg via ORAL
  Filled 2019-11-08 (×2): qty 1

## 2019-11-08 MED ORDER — CARVEDILOL 3.125 MG PO TABS
3.1250 mg | ORAL_TABLET | Freq: Two times a day (BID) | ORAL | Status: DC
  Administered 2019-11-08 – 2019-11-09 (×3): 3.125 mg via ORAL
  Filled 2019-11-08 (×3): qty 1

## 2019-11-08 NOTE — Progress Notes (Signed)
Progress Note  Patient Name: Rebecca Sanford Date of Encounter: 11/08/2019  Primary Cardiologist: Dr. Sherren Mocha  Subjective   Postop day #1 anterior STEMI with PCI and drug-eluting stenting of proximal and mid tandem LAD lesions.  The patient does have an occluded RCA with left right collaterals and moderate circumflex disease with moderately severe LV dysfunction.  She said no recurrent chest pain.  Inpatient Medications    Scheduled Meds: . aspirin EC  81 mg Oral Daily  . atorvastatin  80 mg Oral q1800  . Chlorhexidine Gluconate Cloth  6 each Topical Daily  . enoxaparin (LOVENOX) injection  40 mg Subcutaneous Q24H  . furosemide  40 mg Oral Daily  . losartan  50 mg Oral Daily  . metoprolol tartrate  12.5 mg Oral BID  . sodium chloride flush  3 mL Intravenous Q12H  . ticagrelor  90 mg Oral BID   Continuous Infusions: . sodium chloride     PRN Meds: sodium chloride, acetaminophen, ALPRAZolam, diazepam, fentaNYL (SUBLIMAZE) injection, nitroGLYCERIN, ondansetron (ZOFRAN) IV, sodium chloride flush   Vital Signs    Vitals:   11/08/19 0700 11/08/19 0741 11/08/19 0800 11/08/19 0900  BP: 103/78  106/81 100/69  Pulse: 92  95 87  Resp: (!) 22  20 (!) 24  Temp:  97.8 F (36.6 C)    TempSrc:      SpO2: 93%  94% 93%  Weight:      Height:        Intake/Output Summary (Last 24 hours) at 11/08/2019 0933 Last data filed at 11/08/2019 0800 Gross per 24 hour  Intake 1436.02 ml  Output 2575 ml  Net -1138.98 ml   Last 3 Weights 11/08/2019 11/07/2019 11/07/2019  Weight (lbs) 194 lb 0.1 oz 199 lb 1.2 oz 190 lb 4.1 oz  Weight (kg) 88 kg 90.3 kg 86.3 kg      Telemetry    Sinus rhythm- Personally Reviewed  ECG    Normal sinus rhythm 85 with septal Q waves and anterior T wave inversion- Personally Reviewed  Physical Exam   GEN: No acute distress.   Neck: No JVD Cardiac: RRR, no murmurs, rubs, or gallops.  Respiratory: Clear to auscultation bilaterally. GI: Soft,  nontender, non-distended  MS: No edema; No deformity. Neuro:  Nonfocal  Psych: Normal affect  Extremities-right radial puncture site stable  Labs    High Sensitivity Troponin:   Recent Labs  Lab 11/07/19 0412 11/07/19 0623  TROPONINIHS 157* 12,019*      Chemistry Recent Labs  Lab 11/07/19 0412 11/07/19 0423 11/08/19 0255  NA 139 140 139  K 3.7 3.5 4.0  CL 108 108 105  CO2 20*  --  22  GLUCOSE 133* 130* 99  BUN 13 15 7   CREATININE 0.75 0.80 0.76  CALCIUM 9.7  --  9.4  PROT 7.1  --   --   ALBUMIN 3.8  --   --   AST 23  --   --   ALT 27  --   --   ALKPHOS 89  --   --   BILITOT 0.3  --   --   GFRNONAA >60  --  >60  GFRAA >60  --  >60  ANIONGAP 11  --  12     Hematology Recent Labs  Lab 11/07/19 0412 11/07/19 0423 11/08/19 0255  WBC 9.3  --  9.9  RBC 5.01  --  5.10  HGB 15.4* 14.6 15.5*  HCT 46.4* 43.0 46.6*  MCV 92.6  --  91.4  MCH 30.7  --  30.4  MCHC 33.2  --  33.3  RDW 13.1  --  12.9  PLT 307  --  281    BNPNo results for input(s): BNP, PROBNP in the last 168 hours.   DDimer No results for input(s): DDIMER in the last 168 hours.   Radiology    CARDIAC CATHETERIZATION  Result Date: 11/08/2019 1.  Severe mid LAD stenosis, tandem lesions treated with a single 2.75 x 34 mm resolute Onyx DES 2.  Moderate left circumflex stenosis, does not appear flow-limiting 3.  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  Severe segmental LV dysfunction with akinesis of the anterolateral wall and entire periapical region including the inferoapex, LVEF estimated at 30 to 35% Recommendations: Guideline directed medical therapy, dual antiplatelet therapy with aspirin and ticagrelor at least 12 months without interruption.  The patient's left ventriculogram could be consistent with acute Takotsubo syndrome as well.  However, with the patient's EKG changes and critical stenosis of the LAD, favor STEMI involving the LAD.  We will trend cardiac  markers.  DG Chest Port 1 View  Result Date: 11/07/2019 CLINICAL DATA:  Reflux.  Chest pain EXAM: PORTABLE CHEST 1 VIEW COMPARISON:  None. FINDINGS: Normal cardiac silhouette. Mild linear atelectasis lung bases. No effusion, infiltrate pneumothorax. No acute osseous abnormality. IMPRESSION: Mild basilar atelectasis. Electronically Signed   By: Suzy Bouchard M.D.   On: 11/07/2019 04:45   ECHOCARDIOGRAM COMPLETE  Result Date: 11/07/2019    ECHOCARDIOGRAM REPORT   Patient Name:   Rebecca Sanford Date of Exam: 11/07/2019 Medical Rec #:  [id number]          Height:       63.0 in Accession #:    [id number]         Weight:       199.1 lb Date of Birth:  [birth year]          BSA:          1.930 m Patient Age:    59 years           BP:           129/96 mmHg Patient Gender: F                  HR:           77 bpm. Exam Location:  Inpatient Procedure: 2D Echo, Color Doppler and Cardiac Doppler Indications:    STEMI  History:        Patient has no prior history of Echocardiogram examinations.                 Acute MI and CAD, Signs/Symptoms:Chest Pain; Risk                 Factors:Dyslipidemia.  Sonographer:    Dustin Flock Referring Phys: [id number] ADAM S BARNETT IMPRESSIONS  1. Left ventricular ejection fraction, by estimation, is 30 to 35%. The left ventricle has moderately decreased function. The left ventricle demonstrates regional wall motion abnormalities (see scoring diagram/findings for description). Left ventricular  diastolic parameters are consistent with Grade I diastolic dysfunction (impaired relaxation).  2. Right ventricular systolic function is normal. The right ventricular size is normal. There is normal pulmonary artery systolic pressure.  3. The mitral valve is normal in structure. Trivial mitral valve regurgitation. No evidence of mitral stenosis.  4. The aortic valve is normal in structure. Aortic valve  regurgitation is not visualized. No aortic stenosis is present. FINDINGS  Left Ventricle:  Left ventricular ejection fraction, by estimation, is 30 to 35%. The left ventricle has moderately decreased function. The left ventricle demonstrates regional wall motion abnormalities. Severe akinesis of the left ventricular, entire anteroseptal wall. The left ventricular internal cavity size was normal in size. There is no left ventricular hypertrophy. Left ventricular diastolic parameters are consistent with Grade I diastolic dysfunction (impaired relaxation). Right Ventricle: The right ventricular size is normal. No increase in right ventricular wall thickness. Right ventricular systolic function is normal. There is normal pulmonary artery systolic pressure. The tricuspid regurgitant velocity is 2.07 m/s, and  with an assumed right atrial pressure of 8 mmHg, the estimated right ventricular systolic pressure is 99991111 mmHg. Left Atrium: Left atrial size was normal in size. Right Atrium: Right atrial size was normal in size. Pericardium: There is no evidence of pericardial effusion. Mitral Valve: The mitral valve is normal in structure. Trivial mitral valve regurgitation. No evidence of mitral valve stenosis. Tricuspid Valve: The tricuspid valve is grossly normal. Tricuspid valve regurgitation is trivial. No evidence of tricuspid stenosis. Aortic Valve: The aortic valve is normal in structure. Aortic valve regurgitation is not visualized. No aortic stenosis is present. Pulmonic Valve: The pulmonic valve was normal in structure. Pulmonic valve regurgitation is not visualized. No evidence of pulmonic stenosis. Aorta: The aortic root and ascending aorta are structurally normal, with no evidence of dilitation. IAS/Shunts: The atrial septum is grossly normal.  LEFT VENTRICLE PLAX 2D LVIDd:         4.26 cm  Diastology LVIDs:         3.45 cm  LV e' lateral:   4.46 cm/s LV PW:         1.08 cm  LV E/e' lateral: 19.4 LV IVS:        0.97 cm  LV e' medial:    4.68 cm/s LVOT diam:     2.10 cm  LV E/e' medial:  18.5 LV SV:          58 LV SV Index:   30 LVOT Area:     3.46 cm  RIGHT VENTRICLE RV Basal diam:  2.33 cm RV S prime:     10.70 cm/s TAPSE (M-mode): 2.4 cm LEFT ATRIUM             Index       RIGHT ATRIUM           Index LA diam:        3.50 cm 1.81 cm/m  RA Area:     13.30 cm LA Vol (A2C):   37.5 ml 19.43 ml/m RA Volume:   32.90 ml  17.05 ml/m LA Vol (A4C):   52.6 ml 27.26 ml/m LA Biplane Vol: 48.2 ml 24.98 ml/m  AORTIC VALVE LVOT Vmax:   80.80 cm/s LVOT Vmean:  46.400 cm/s LVOT VTI:    0.168 m  AORTA Ao Root diam: 2.50 cm MITRAL VALVE                TRICUSPID VALVE MV Area (PHT): 3.99 cm     TR Peak grad:   17.1 mmHg MV Decel Time: 190 msec     TR Vmax:        207.00 cm/s MV E velocity: 86.40 cm/s MV A velocity: 106.00 cm/s  SHUNTS MV E/A ratio:  0.82         Systemic VTI:  0.17 m  Systemic Diam: 2.10 cm Mertie Moores MD Electronically signed by Mertie Moores MD Signature Date/Time: 11/07/2019/1:00:26 PM    Final     Cardiac Studies   Cardiac catheterization (11/07/2019)  Conclusion  1.  Severe mid LAD stenosis, tandem lesions treated with a single 2.75 x 34 mm resolute Onyx DES 2.  Moderate left circumflex stenosis, does not appear flow-limiting 3.  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  Severe segmental LV dysfunction with akinesis of the anterolateral wall and entire periapical region including the inferoapex, LVEF estimated at 30 to 35%  Recommendations: Guideline directed medical therapy, dual antiplatelet therapy with aspirin and ticagrelor at least 12 months without interruption.  The patient's left ventriculogram could be consistent with acute Takotsubo syndrome as well.  However, with the patient's EKG changes and critical stenosis of the LAD, favor STEMI involving the LAD.  We will trend cardiac markers.   Coronary Diagrams  Diagnostic Dominance: Right  Intervention   2D echocardiogram (11/07/2019)  IMPRESSIONS    1. Left  ventricular ejection fraction, by estimation, is 30 to 35%. The  left ventricle has moderately decreased function. The left ventricle  demonstrates regional wall motion abnormalities (see scoring  diagram/findings for description). Left ventricular  diastolic parameters are consistent with Grade I diastolic dysfunction  (impaired relaxation).  2. Right ventricular systolic function is normal. The right ventricular  size is normal. There is normal pulmonary artery systolic pressure.  3. The mitral valve is normal in structure. Trivial mitral valve  regurgitation. No evidence of mitral stenosis.  4. The aortic valve is normal in structure. Aortic valve regurgitation is  not visualized. No aortic stenosis is present.    Patient Profile     60 y.o. female without prior cardiac history who did have untreated hyperlipidemia developed chest and arm pain on Saturday night/Sunday morning.  She is brought to the Cath Lab by Dr. Burt Knack revealing occluded dominant RCA with left-to-right collaterals, moderate circumflex disease and high-grade tandem proximal mid LAD stenoses which were stented with 1 long drug-eluting stent with excellent result.  Her EF was 30 to 35% with anteroapical wall motion abnormality consistent with her known disease.  Her troponins rose to 12,000.  She is currently asymptomatic.  Assessment & Plan    1: Coronary artery disease-postop day 1 anterior STEMI now with septal Q waves, anterior T wave inversion and a peak troponin of 12,000 status post PCI drug-eluting stenting of the proximal LAD with a 34 mm drug-eluting stent with excellent result.  She does have an occluded dominant RCA with right to left collaterals and moderate circumflex disease.  She is on optimal medical therapy including aspirin and Brilinta which she will take uninterrupted for 12 months.  2: Hyperlipidemia-history of hyperlipidemia untreated with LDL of 201 currently on high-dose atorvastatin  3:  Ischemic cardiomyopathy-EF of 30% with anteroapical long extremity.  She is on metoprolol which will change to low-dose carvedilol as well as losartan 50 mg.  Her blood pressure is soft.  We will decrease her losartan to 25 mg and decrease her furosemide from 40 to 20 mg as well.  Cardiac rehab seeing her.  Will transfer to a telemetry bed today and continue cardiac rehab and ambulation with intent to discharge home in the next 24 to 48 hours.  We will need to carefully titrate her medications based on her blood pressure.  For questions or updates, please contact [place] Please consult [email or web address] for contact info under  Signed, Quay Burow, MD  11/08/2019, 9:33 AM

## 2019-11-08 NOTE — Care Management (Signed)
Per Shakeema P. W/Pharmacy help desk Co-pay amount for Brilinta 90 mg. bid for a 30 day supply $15.00. Mail order for a 90 day supply of Brilinta 90 mg bid, $30.00  No PA required No deductible [place] Mail order [email or web address]

## 2019-11-08 NOTE — Progress Notes (Signed)
CARDIAC REHAB PHASE I   PRE:  Rate/Rhythm: 92 SR  BP:  Supine:   Sitting: 100/69  Standing:    SaO2: 91%RA  MODE:  Ambulation: 370 ft   POST:  Rate/Rhythm: 104 ST  BP:  Supine:   Sitting: 109/81  Standing:    SaO2: 95%RA 0900-1000 Pt walked 370 ft on RA with steady gait and no CP. Tolerated well. MI education began with pt and husband who voiced understanding. Stressed importance of brilinta with stent. Reviewed NTG use, MI restrictions, heart  Healthy food choices, 2000 mg sodium restriction and CRP 2. Discussed and referred to [facility] program. Pt works at [employer] and not sure if work schedule will allow. Interested in Hospital doctor. Pt is interested in participating in Virtual Cardiac and Pulmonary Rehab. Pt advised that Virtual Cardiac and Pulmonary Rehab is provided at no cost to the patient.  Checklist:  1. Pt has smart device  ie smartphone and/or ipad for downloading an app  Yes 2. Reliable internet/wifi service    Yes 3. Understands how to use their smartphone and navigate within an app.  Yes   Pt verbalized understanding and is in agreement.    Graylon Good, RN BSN  11/08/2019 9:59 AM

## 2019-11-08 NOTE — Progress Notes (Signed)
Pt sleeping, BP soft 87/69 (77), NTG gtt has been weaned off, received po Antihypertensives and prn xanax at bedtime. Woke pt up, alert, mentating well, BP 98/77 (85), denies any chest pain/pressure/discomfort.

## 2019-11-08 NOTE — Progress Notes (Signed)
Patient transferred from [facility] at 1614hrs.  Oriented to unit and plan of care for shift.  Verbalized understanding.

## 2019-11-09 ENCOUNTER — Encounter (HOSPITAL_COMMUNITY): Payer: Self-pay | Admitting: Cardiovascular Disease

## 2019-11-09 DIAGNOSIS — E785 Hyperlipidemia, unspecified: Secondary | ICD-10-CM

## 2019-11-09 DIAGNOSIS — I255 Ischemic cardiomyopathy: Secondary | ICD-10-CM

## 2019-11-09 LAB — BASIC METABOLIC PANEL
Anion gap: 11 (ref 5–15)
BUN: 17 mg/dL (ref 6–20)
CO2: 26 mmol/L (ref 22–32)
Calcium: 9.3 mg/dL (ref 8.9–10.3)
Chloride: 103 mmol/L (ref 98–111)
Creatinine, Ser: 1.1 mg/dL — ABNORMAL HIGH (ref 0.44–1.00)
GFR calc Af Amer: >60 mL/min (ref >60)
GFR calc non Af Amer: 56 mL/min — ABNORMAL LOW (ref >60)
Glucose, Bld: 99 mg/dL (ref 70–99)
Potassium: 3.8 mmol/L (ref 3.5–5.1)
Sodium: 140 mmol/L (ref 135–145)

## 2019-11-09 MED ORDER — FUROSEMIDE 20 MG PO TABS: 20.0000 mg | ORAL_TABLET | Freq: Every day | ORAL | 0 refills | Status: DC | PRN

## 2019-11-09 MED ORDER — ATORVASTATIN CALCIUM 80 MG PO TABS: 80.0000 mg | ORAL_TABLET | Freq: Every day | ORAL | 1 refills | Status: DC

## 2019-11-09 MED ORDER — TICAGRELOR 90 MG PO TABS: 90.0000 mg | ORAL_TABLET | Freq: Two times a day (BID) | ORAL | 2 refills | Status: DC

## 2019-11-09 MED ORDER — CARVEDILOL 3.125 MG PO TABS: 3.1250 mg | ORAL_TABLET | Freq: Two times a day (BID) | ORAL | 0 refills | Status: DC

## 2019-11-09 MED ORDER — LOSARTAN POTASSIUM 25 MG PO TABS: 25.0000 mg | ORAL_TABLET | Freq: Every day | ORAL | 1 refills | Status: DC

## 2019-11-09 MED ORDER — NITROGLYCERIN 0.4 MG SL SUBL: 0.4000 mg | SUBLINGUAL_TABLET | SUBLINGUAL | 2 refills | Status: DC | PRN

## 2019-11-09 MED ORDER — ASPIRIN 81 MG PO TBEC: 81.0000 mg | DELAYED_RELEASE_TABLET | Freq: Every day | ORAL | 1 refills | Status: DC

## 2019-11-09 MED FILL — BRILINTA 90 MG TABLET: 90 | 30 days supply | Qty: 60 | Fill #0

## 2019-11-09 NOTE — Progress Notes (Addendum)
Progress Note  Patient Name: Rebecca Sanford Date of Encounter: 11/09/2019  Primary Cardiologist: Sherren Mocha, MD   Subjective   No chest pain overnight. Walked in the hallway with cardiac rehab and felt well.   Inpatient Medications    Scheduled Meds: . aspirin EC  81 mg Oral Daily  . atorvastatin  80 mg Oral q1800  . carvedilol  3.125 mg Oral BID WC  . Chlorhexidine Gluconate Cloth  6 each Topical Daily  . enoxaparin (LOVENOX) injection  40 mg Subcutaneous Q24H  . furosemide  20 mg Oral Daily  . losartan  25 mg Oral Daily  . sodium chloride flush  3 mL Intravenous Q12H  . ticagrelor  90 mg Oral BID   Continuous Infusions: . sodium chloride     PRN Meds: sodium chloride, acetaminophen, ALPRAZolam, diazepam, fentaNYL (SUBLIMAZE) injection, nitroGLYCERIN, ondansetron (ZOFRAN) IV, sodium chloride, sodium chloride flush   Vital Signs    Vitals:   11/08/19 1613 11/08/19 1750 11/08/19 2006 11/09/19 0439  BP: 96/61 97/71 (!) 87/62 92/68  Pulse: 93 88 94 90  Resp: 16     Temp: 98.5 F (36.9 C)  98.1 F (36.7 C) 98.4 F (36.9 C)  TempSrc: Oral  Oral Oral  SpO2: 98%  93% 95%  Weight:    87.9 kg  Height:        Intake/Output Summary (Last 24 hours) at 11/09/2019 0810 Last data filed at 11/08/2019 1800 Gross per 24 hour  Intake 540 ml  Output --  Net 540 ml   Last 3 Weights 11/09/2019 11/08/2019 11/07/2019  Weight (lbs) 193 lb 11.2 oz 194 lb 0.1 oz 199 lb 1.2 oz  Weight (kg) 87.862 kg 88 kg 90.3 kg      Telemetry    SR - Personally Reviewed  ECG    SR with evolving TW changes in anteroseptal leads - Personally Reviewed  Physical Exam  Pleasant WF, sitting up in bed.  GEN: No acute distress.   Neck: No JVD Cardiac: RRR, no murmurs, rubs, or gallops.  Respiratory: Mild crackles at bases. GI: Soft, nontender, non-distended  MS: No edema; No deformity. Neuro:  Nonfocal  Psych: Normal affect   Labs    High Sensitivity Troponin:   Recent Labs  Lab  11/07/19 0412 11/07/19 0623  TROPONINIHS 157* 12,019*      Chemistry Recent Labs  Lab 11/07/19 0412 11/07/19 0412 11/07/19 0423 11/08/19 0255 11/09/19 0403  NA 139   < > 140 139 140  K 3.7   < > 3.5 4.0 3.8  CL 108   < > 108 105 103  CO2 20*  --   --  22 26  GLUCOSE 133*   < > 130* 99 99  BUN 13   < > 15 7 17   CREATININE 0.75   < > 0.80 0.76 1.10*  CALCIUM 9.7  --   --  9.4 9.3  PROT 7.1  --   --   --   --   ALBUMIN 3.8  --   --   --   --   AST 23  --   --   --   --   ALT 27  --   --   --   --   ALKPHOS 89  --   --   --   --   BILITOT 0.3  --   --   --   --   GFRNONAA >60  --   --  >  60 56*  GFRAA >60  --   --  >60 >60  ANIONGAP 11  --   --  12 11   < > = values in this interval not displayed.     Hematology Recent Labs  Lab 11/07/19 0412 11/07/19 0423 11/08/19 0255  WBC 9.3  --  9.9  RBC 5.01  --  5.10  HGB 15.4* 14.6 15.5*  HCT 46.4* 43.0 46.6*  MCV 92.6  --  91.4  MCH 30.7  --  30.4  MCHC 33.2  --  33.3  RDW 13.1  --  12.9  PLT 307  --  281    BNPNo results for input(s): BNP, PROBNP in the last 168 hours.   DDimer No results for input(s): DDIMER in the last 168 hours.   Radiology    ECHOCARDIOGRAM COMPLETE  Result Date: 11/07/2019    ECHOCARDIOGRAM REPORT   Patient Name:   Rebecca Sanford Date of Exam: 11/07/2019 Medical Rec #:  [id number]          Height:       63.0 in Accession #:    [id number]         Weight:       199.1 lb Date of Birth:  07-04-1961          BSA:          1.930 m Patient Age:    59 years           BP:           129/96 mmHg Patient Gender: F                  HR:           77 bpm. Exam Location:  Inpatient Procedure: 2D Echo, Color Doppler and Cardiac Doppler Indications:    STEMI  History:        Patient has no prior history of Echocardiogram examinations.                 Acute MI and CAD, Signs/Symptoms:Chest Pain; Risk                 Factors:Dyslipidemia.  Sonographer:    Dustin Flock Referring Phys: [phone] ADAM S BARNETT  IMPRESSIONS  1. Left ventricular ejection fraction, by estimation, is 30 to 35%. The left ventricle has moderately decreased function. The left ventricle demonstrates regional wall motion abnormalities (see scoring diagram/findings for description). Left ventricular  diastolic parameters are consistent with Grade I diastolic dysfunction (impaired relaxation).  2. Right ventricular systolic function is normal. The right ventricular size is normal. There is normal pulmonary artery systolic pressure.  3. The mitral valve is normal in structure. Trivial mitral valve regurgitation. No evidence of mitral stenosis.  4. The aortic valve is normal in structure. Aortic valve regurgitation is not visualized. No aortic stenosis is present. FINDINGS  Left Ventricle: Left ventricular ejection fraction, by estimation, is 30 to 35%. The left ventricle has moderately decreased function. The left ventricle demonstrates regional wall motion abnormalities. Severe akinesis of the left ventricular, entire anteroseptal wall. The left ventricular internal cavity size was normal in size. There is no left ventricular hypertrophy. Left ventricular diastolic parameters are consistent with Grade I diastolic dysfunction (impaired relaxation). Right Ventricle: The right ventricular size is normal. No increase in right ventricular wall thickness. Right ventricular systolic function is normal. There is normal pulmonary artery systolic pressure. The tricuspid regurgitant velocity is 2.07 m/s, and  with  an assumed right atrial pressure of 8 mmHg, the estimated right ventricular systolic pressure is 99991111 mmHg. Left Atrium: Left atrial size was normal in size. Right Atrium: Right atrial size was normal in size. Pericardium: There is no evidence of pericardial effusion. Mitral Valve: The mitral valve is normal in structure. Trivial mitral valve regurgitation. No evidence of mitral valve stenosis. Tricuspid Valve: The tricuspid valve is grossly normal.  Tricuspid valve regurgitation is trivial. No evidence of tricuspid stenosis. Aortic Valve: The aortic valve is normal in structure. Aortic valve regurgitation is not visualized. No aortic stenosis is present. Pulmonic Valve: The pulmonic valve was normal in structure. Pulmonic valve regurgitation is not visualized. No evidence of pulmonic stenosis. Aorta: The aortic root and ascending aorta are structurally normal, with no evidence of dilitation. IAS/Shunts: The atrial septum is grossly normal.  LEFT VENTRICLE PLAX 2D LVIDd:         4.26 cm  Diastology LVIDs:         3.45 cm  LV e' lateral:   4.46 cm/s LV PW:         1.08 cm  LV E/e' lateral: 19.4 LV IVS:        0.97 cm  LV e' medial:    4.68 cm/s LVOT diam:     2.10 cm  LV E/e' medial:  18.5 LV SV:         58 LV SV Index:   30 LVOT Area:     3.46 cm  RIGHT VENTRICLE RV Basal diam:  2.33 cm RV S prime:     10.70 cm/s TAPSE (M-mode): 2.4 cm LEFT ATRIUM             Index       RIGHT ATRIUM           Index LA diam:        3.50 cm 1.81 cm/m  RA Area:     13.30 cm LA Vol (A2C):   37.5 ml 19.43 ml/m RA Volume:   32.90 ml  17.05 ml/m LA Vol (A4C):   52.6 ml 27.26 ml/m LA Biplane Vol: 48.2 ml 24.98 ml/m  AORTIC VALVE LVOT Vmax:   80.80 cm/s LVOT Vmean:  46.400 cm/s LVOT VTI:    0.168 m  AORTA Ao Root diam: 2.50 cm MITRAL VALVE                TRICUSPID VALVE MV Area (PHT): 3.99 cm     TR Peak grad:   17.1 mmHg MV Decel Time: 190 msec     TR Vmax:        207.00 cm/s MV E velocity: 86.40 cm/s MV A velocity: 106.00 cm/s  SHUNTS MV E/A ratio:  0.82         Systemic VTI:  0.17 m                             Systemic Diam: 2.10 cm Mertie Moores MD Electronically signed by Mertie Moores MD Signature Date/Time: 11/07/2019/1:00:26 PM    Final     Cardiac Studies   Cath: 11/07/19  Conclusion  1.  Severe mid LAD stenosis, tandem lesions treated with a single 2.75 x 34 mm resolute Onyx DES 2.  Moderate left circumflex stenosis, does not appear flow-limiting 3.  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  Severe segmental LV dysfunction with akinesis of the anterolateral wall and entire periapical region  including the inferoapex, LVEF estimated at 30 to 35%  Recommendations: Guideline directed medical therapy, dual antiplatelet therapy with aspirin and ticagrelor at least 12 months without interruption.  The patient's left ventriculogram could be consistent with acute Takotsubo syndrome as well.  However, with the patient's EKG changes and critical stenosis of the LAD, favor STEMI involving the LAD.  We will trend cardiac markers.   Diagnostic Dominance: Right  Intervention    Echo: 11/07/19  IMPRESSIONS    1. Left ventricular ejection fraction, by estimation, is 30 to 35%. The  left ventricle has moderately decreased function. The left ventricle  demonstrates regional wall motion abnormalities (see scoring  diagram/findings for description). Left ventricular  diastolic parameters are consistent with Grade I diastolic dysfunction  (impaired relaxation).  2. Right ventricular systolic function is normal. The right ventricular  size is normal. There is normal pulmonary artery systolic pressure.  3. The mitral valve is normal in structure. Trivial mitral valve  regurgitation. No evidence of mitral stenosis.  4. The aortic valve is normal in structure. Aortic valve regurgitation is  not visualized. No aortic stenosis is present.    Patient Profile     59 y.o. female without prior cardiac history who did have untreated hyperlipidemia developed chest and arm pain on Saturday night/Sunday morning.  She is brought to the Cath Lab by Dr. Burt Knack revealing occluded dominant RCA with left-to-right collaterals, moderate circumflex disease and high-grade tandem proximal mid LAD stenoses which were stented with 1 long drug-eluting stent with excellent result.  Her EF was 30 to 35% with anteroapical wall motion abnormality consistent  with her known disease.  Her troponins rose to 12,000.  She is currently asymptomatic.  Assessment & Plan    1. Anterior STEMI: s/p DES to the pLAD with occluded, but nondominant RCA with right to left collaterals and moderate Lcx disease. hsTn peaked at 12000. On DAPT with ASA/Brilinta for at least one year. Worked well with cardiac rehab yesterday without recurrent chest pain.  2. ICM: EF of 30% with severe akinesis in the LV and entire anteroseptal wall. Has been started on coreg 3.125mg  BID and losartan 25mg  daily. Blood pressures are soft but tolerating. Will hold on further titration at this time. Review with MD regarding lifevest potential prior to discharge. -- on lasix 20mg  daily  3. HL: LDL 201, now on high dose statin.  For questions or updates, please contact [place] Please consult [email or web address] for contact info under        Signed, Reino Bellis, NP  11/09/2019, 8:10 AM    Agree with note by Reino Bellis NP-C  Postop day #2 anterior STEMI true PCI stenting of proximal LAD.  She does have a total RCA with left-to-right collaterals and EF of 30% by 2D echo.  She is on appropriate medications.  She been walking without symptoms denies chest pain.  She is a candidate for LifeVest for primary prevention given her anterior MI and low EF.  If she can be fitted today she can be discharged home with TOC 7 and follow-up with Dr. Burt Knack after that.  Her exam is benign.  Telemetry shows no arrhythmias.  Lorretta Harp, M.D., [place], [facility], [place] [address] [place], [place]  [id number]  [phone] 11/09/2019 10:25 AM

## 2019-11-09 NOTE — Discharge Summary (Addendum)
Discharge Summary    Patient ID: Rebecca Sanford,  MRN: [id number], DOB/AGE: 09/30/1960 59 y.o.  Admit date: 11/07/2019 Discharge date: 11/09/2019  Primary Care Provider: No primary care provider on file. Primary Cardiologist: Sherren Mocha, MD  Discharge Diagnoses    Principal Problem:   STEMI (ST elevation myocardial infarction) [facility]) Active Problems:   Acute ST elevation myocardial infarction (STEMI) of anterior wall (HCC)   Ischemic cardiomyopathy   Hyperlipidemia   Allergies Allergies  Allergen Reactions   Codeine Nausea And Vomiting   Hydrocodone Nausea And Vomiting   Nitrofurantoin Macrocrystal Hives   Sulfa Antibiotics Hives    Diagnostic Studies/Procedures    Cath: 11/07/19   Conclusion   1.  Severe mid LAD stenosis, tandem lesions treated with a single 2.75 x 34 mm resolute Onyx DES 2.  Moderate left circumflex stenosis, does not appear flow-limiting 3.  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  Severe segmental LV dysfunction with akinesis of the anterolateral wall and entire periapical region including the inferoapex, LVEF estimated at 30 to 35%   Recommendations: Guideline directed medical therapy, dual antiplatelet therapy with aspirin and ticagrelor at least 12 months without interruption.  The patient's left ventriculogram could be consistent with acute Takotsubo syndrome as well.  However, with the patient's EKG changes and critical stenosis of the LAD, favor STEMI involving the LAD.  We will trend cardiac markers.    Diagnostic Dominance: Right  Intervention      Echo: 11/07/19   IMPRESSIONS     1. Left ventricular ejection fraction, by estimation, is 30 to 35%. The  left ventricle has moderately decreased function. The left ventricle  demonstrates regional wall motion abnormalities (see scoring  diagram/findings for description). Left ventricular   diastolic parameters are consistent with Grade I diastolic  dysfunction  (impaired relaxation).   2. Right ventricular systolic function is normal. The right ventricular  size is normal. There is normal pulmonary artery systolic pressure.   3. The mitral valve is normal in structure. Trivial mitral valve  regurgitation. No evidence of mitral stenosis.   4. The aortic valve is normal in structure. Aortic valve regurgitation is  not visualized. No aortic stenosis is present.     _____________   History of Present Illness     Rebecca Sanford is a 59 yo female with PMH of HTN, HL and prior tobacco use who presented to the ED with chest pain. Had acute onset chest pain at about 5 pm.  Felt like her heartburn but worse.  Had intermittent episodes throughout the night of admission.  No other associated symptoms.  Eventually decided to call EMS.  Initial 12 lead ECG with mild ST elevation V1-V3.  Continued to have pain on arrival to ED.  ECG with some improvement in ST elevation.  Labs with normal CBC, BMP.  Cath lab activated for STEMI.  Hospital Course     1. Anterior STEMI: s/p DES to the pLAD with occluded, but nondominant RCA with right to left collaterals and moderate Lcx disease. hsTn peaked at 12000. On DAPT with ASA/Brilinta for at least one year. Medical therapy for residual disease. Worked well with cardiac rehab without recurrent chest pain.   2. ICM: EF of 30% with severe akinesis in the LV and entire anteroseptal wall. Has been started on coreg 3.125mg  BID and losartan 25mg  daily. Blood pressures are soft but tolerating. Will hold on further titration prior to discharge. Consider transition to [facility]  as an outpatient if blood pressures can tolerate.  -- Considered an appropriate candidate for lifevest. Rep contacted and fitted prior to discharge. -- on lasix 20mg  daily PRN at discharge   3. HL: LDL 201, now on high dose statin. -- will need FLP/LFTs in 8 weeks  [name] was seen by Dr. Gwenlyn Found and determined stable for discharge home.  Follow up in the office has been arranged. Medications are listed below.  _____________  Discharge Vitals Blood pressure 95/76, pulse 88, temperature 98.1 F (36.7 C), temperature source Oral, resp. rate 16, height 5\' 3"  (1.6 m), weight 87.9 kg, SpO2 96 %.  Filed Weights   11/07/19 0615 11/08/19 0630 11/09/19 0439  Weight: 90.3 kg 88 kg 87.9 kg    Labs & Radiologic Studies    CBC Recent Labs    11/07/19 0412 11/07/19 0412 11/07/19 0423 11/08/19 0255  WBC 9.3  --   --  9.9  NEUTROABS 5.5  --   --   --   HGB 15.4*   < > 14.6 15.5*  HCT 46.4*   < > 43.0 46.6*  MCV 92.6  --   --  91.4  PLT 307  --   --  281   < > = values in this interval not displayed.   Basic Metabolic Panel Recent Labs    11/08/19 0255 11/09/19 0403  NA 139 140  K 4.0 3.8  CL 105 103  CO2 22 26  GLUCOSE 99 99  BUN 7 17  CREATININE 0.76 1.10*  CALCIUM 9.4 9.3   Liver Function Tests Recent Labs    11/07/19 0412  AST 23  ALT 27  ALKPHOS 89  BILITOT 0.3  PROT 7.1  ALBUMIN 3.8   No results for input(s): LIPASE, AMYLASE in the last 72 hours. Cardiac Enzymes No results for input(s): CKTOTAL, CKMB, CKMBINDEX, TROPONINI in the last 72 hours. BNP Invalid input(s): POCBNP D-Dimer No results for input(s): DDIMER in the last 72 hours. Hemoglobin A1C Recent Labs    11/07/19 0412  HGBA1C 5.3   Fasting Lipid Panel Recent Labs    11/07/19 0412  CHOL 287*  HDL 50  LDLCALC 201*  TRIG 178*  CHOLHDL 5.7   Thyroid Function Tests No results for input(s): TSH, T4TOTAL, T3FREE, THYROIDAB in the last 72 hours.  Invalid input(s): FREET3 _____________  CARDIAC CATHETERIZATION  Result Date: 11/08/2019 1.  Severe mid LAD stenosis, tandem lesions treated with a single 2.75 x 34 mm resolute Onyx DES 2.  Moderate left circumflex stenosis, does not appear flow-limiting 3.  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  Severe segmental LV dysfunction with akinesis of the  anterolateral wall and entire periapical region including the inferoapex, LVEF estimated at 30 to 35% Recommendations: Guideline directed medical therapy, dual antiplatelet therapy with aspirin and ticagrelor at least 12 months without interruption.  The patient's left ventriculogram could be consistent with acute Takotsubo syndrome as well.  However, with the patient's EKG changes and critical stenosis of the LAD, favor STEMI involving the LAD.  We will trend cardiac markers.  DG Chest Port 1 View  Result Date: 11/07/2019 CLINICAL DATA:  Reflux.  Chest pain EXAM: PORTABLE CHEST 1 VIEW COMPARISON:  None. FINDINGS: Normal cardiac silhouette. Mild linear atelectasis lung bases. No effusion, infiltrate pneumothorax. No acute osseous abnormality. IMPRESSION: Mild basilar atelectasis. Electronically Signed   By: Suzy Bouchard M.D.   On: 11/07/2019 04:45   ECHOCARDIOGRAM COMPLETE  Result Date:  11/07/2019    ECHOCARDIOGRAM REPORT   Patient Name:   AVERIANA WUENSCH Date of Exam: 11/07/2019 Medical Rec #:  [id number]          Height:       63.0 in Accession #:    [id number]         Weight:       199.1 lb Date of Birth:  1960/08/11          BSA:          1.930 m Patient Age:    57 years           BP:           129/96 mmHg Patient Gender: F                  HR:           77 bpm. Exam Location:  Inpatient Procedure: 2D Echo, Color Doppler and Cardiac Doppler Indications:    STEMI  History:        Patient has no prior history of Echocardiogram examinations.                 Acute MI and CAD, Signs/Symptoms:Chest Pain; Risk                 Factors:Dyslipidemia.  Sonographer:    Dustin Flock Referring Phys: [phone] ADAM S BARNETT IMPRESSIONS  1. Left ventricular ejection fraction, by estimation, is 30 to 35%. The left ventricle has moderately decreased function. The left ventricle demonstrates regional wall motion abnormalities (see scoring diagram/findings for description). Left ventricular  diastolic parameters are  consistent with Grade I diastolic dysfunction (impaired relaxation).  2. Right ventricular systolic function is normal. The right ventricular size is normal. There is normal pulmonary artery systolic pressure.  3. The mitral valve is normal in structure. Trivial mitral valve regurgitation. No evidence of mitral stenosis.  4. The aortic valve is normal in structure. Aortic valve regurgitation is not visualized. No aortic stenosis is present. FINDINGS  Left Ventricle: Left ventricular ejection fraction, by estimation, is 30 to 35%. The left ventricle has moderately decreased function. The left ventricle demonstrates regional wall motion abnormalities. Severe akinesis of the left ventricular, entire anteroseptal wall. The left ventricular internal cavity size was normal in size. There is no left ventricular hypertrophy. Left ventricular diastolic parameters are consistent with Grade I diastolic dysfunction (impaired relaxation). Right Ventricle: The right ventricular size is normal. No increase in right ventricular wall thickness. Right ventricular systolic function is normal. There is normal pulmonary artery systolic pressure. The tricuspid regurgitant velocity is 2.07 m/s, and  with an assumed right atrial pressure of 8 mmHg, the estimated right ventricular systolic pressure is 99991111 mmHg. Left Atrium: Left atrial size was normal in size. Right Atrium: Right atrial size was normal in size. Pericardium: There is no evidence of pericardial effusion. Mitral Valve: The mitral valve is normal in structure. Trivial mitral valve regurgitation. No evidence of mitral valve stenosis. Tricuspid Valve: The tricuspid valve is grossly normal. Tricuspid valve regurgitation is trivial. No evidence of tricuspid stenosis. Aortic Valve: The aortic valve is normal in structure. Aortic valve regurgitation is not visualized. No aortic stenosis is present. Pulmonic Valve: The pulmonic valve was normal in structure. Pulmonic valve  regurgitation is not visualized. No evidence of pulmonic stenosis. Aorta: The aortic root and ascending aorta are structurally normal, with no evidence of dilitation. IAS/Shunts: The atrial septum is grossly normal.  LEFT VENTRICLE PLAX  2D LVIDd:         4.26 cm  Diastology LVIDs:         3.45 cm  LV e' lateral:   4.46 cm/s LV PW:         1.08 cm  LV E/e' lateral: 19.4 LV IVS:        0.97 cm  LV e' medial:    4.68 cm/s LVOT diam:     2.10 cm  LV E/e' medial:  18.5 LV SV:         58 LV SV Index:   30 LVOT Area:     3.46 cm  RIGHT VENTRICLE RV Basal diam:  2.33 cm RV S prime:     10.70 cm/s TAPSE (M-mode): 2.4 cm LEFT ATRIUM             Index       RIGHT ATRIUM           Index LA diam:        3.50 cm 1.81 cm/m  RA Area:     13.30 cm LA Vol (A2C):   37.5 ml 19.43 ml/m RA Volume:   32.90 ml  17.05 ml/m LA Vol (A4C):   52.6 ml 27.26 ml/m LA Biplane Vol: 48.2 ml 24.98 ml/m  AORTIC VALVE LVOT Vmax:   80.80 cm/s LVOT Vmean:  46.400 cm/s LVOT VTI:    0.168 m  AORTA Ao Root diam: 2.50 cm MITRAL VALVE                TRICUSPID VALVE MV Area (PHT): 3.99 cm     TR Peak grad:   17.1 mmHg MV Decel Time: 190 msec     TR Vmax:        207.00 cm/s MV E velocity: 86.40 cm/s MV A velocity: 106.00 cm/s  SHUNTS MV E/A ratio:  0.82         Systemic VTI:  0.17 m                             Systemic Diam: 2.10 cm Mertie Moores MD Electronically signed by Mertie Moores MD Signature Date/Time: 11/07/2019/1:00:26 PM    Final    Disposition   Pt is being discharged home today in good condition.  Follow-up Plans & Appointments      Discharge Instructions     (HEART FAILURE PATIENTS) Call MD:  Anytime you have any of the following symptoms: 1) 3 pound weight gain in 24 hours or 5 pounds in 1 week 2) shortness of breath, with or without a dry hacking cough 3) swelling in the hands, feet or stomach 4) if you have to sleep on extra pillows at night in order to breathe.   Complete by: As directed    Amb Referral to Cardiac  Rehabilitation   Complete by: As directed    Diagnosis:  STEMI Coronary Stents     After initial evaluation and assessments completed: Virtual Based Care may be provided alone or in conjunction with Phase 2 Cardiac Rehab based on patient barriers.: Yes   Call MD for:  redness, tenderness, or signs of infection (pain, swelling, redness, odor or green/yellow discharge around incision site)   Complete by: As directed    Diet - low sodium heart healthy   Complete by: As directed    Discharge instructions   Complete by: As directed    Radial Site Care Refer to this sheet in  the next few weeks. These instructions provide you with information on caring for yourself after your procedure. Your caregiver may also give you more specific instructions. Your treatment has been planned according to current medical practices, but problems sometimes occur. Call your caregiver if you have any problems or questions after your procedure. HOME CARE INSTRUCTIONS You may shower the day after the procedure. Remove the bandage (dressing) and gently wash the site with plain soap and water. Gently pat the site dry.  Do not apply powder or lotion to the site.  Do not submerge the affected site in water for 3 to 5 days.  Inspect the site at least twice daily.  Do not flex or bend the affected arm for 24 hours.  No lifting over 5 pounds (2.3 kg) for 5 days after your procedure.  Do not drive home if you are discharged the same day of the procedure. Have someone else drive you.  You may drive 24 hours after the procedure unless otherwise instructed by your caregiver.  What to expect: Any bruising will usually fade within 1 to 2 weeks.  Blood that collects in the tissue (hematoma) may be painful to the touch. It should usually decrease in size and tenderness within 1 to 2 weeks.  SEEK IMMEDIATE MEDICAL CARE IF: You have unusual pain at the radial site.  You have redness, warmth, swelling, or pain at the radial site.    You have drainage (other than a small amount of blood on the dressing).  You have chills.  You have a fever or persistent symptoms for more than 72 hours.  You have a fever and your symptoms suddenly get worse.  Your arm becomes pale, cool, tingly, or numb.  You have heavy bleeding from the site. Hold pressure on the site.   PLEASE DO NOT MISS ANY DOSES OF YOUR BRILINTA!!!!! Also keep a log of you blood pressures and bring back to your follow up appt. Please call the office with any questions.   Patients taking blood thinners should generally stay away from medicines like ibuprofen, Advil, Motrin, naproxen, and Aleve due to risk of stomach bleeding. You may take Tylenol as directed or talk to your primary doctor about alternatives.  No driving until seen at your follow up appt given the need for your lifevest.   Increase activity slowly   Complete by: As directed         Discharge Medications     Medication List     TAKE these medications    aspirin 81 MG EC tablet Take 1 tablet (81 mg total) by mouth daily. Start taking on: November 10, 2019   atorvastatin 80 MG tablet Commonly known as: LIPITOR Take 1 tablet (80 mg total) by mouth daily at 6 PM.   carvedilol 3.125 MG tablet Commonly known as: COREG Take 1 tablet (3.125 mg total) by mouth 2 (two) times daily with a meal.   furosemide 20 MG tablet Commonly known as: LASIX Take 1 tablet (20 mg total) by mouth daily as needed. For weight gain of 3lbs in a day, 5 lbs in a week or shortness of breath   losartan 25 MG tablet Commonly known as: COZAAR Take 1 tablet (25 mg total) by mouth daily. Start taking on: November 10, 2019   nitroGLYCERIN 0.4 MG SL tablet Commonly known as: NITROSTAT Place 1 tablet (0.4 mg total) under the tongue every 5 (five) minutes x 3 doses as needed for chest pain.   omeprazole  20 MG capsule Commonly known as: PRILOSEC Take 20 mg by mouth once.   ticagrelor 90 MG Tabs tablet Commonly known  as: BRILINTA Take 1 tablet (90 mg total) by mouth 2 (two) times daily.        Yes                               AHA/ACC Clinical Performance & Quality Measures: Aspirin prescribed? - Yes ADP Receptor Inhibitor (Plavix/Clopidogrel, Brilinta/Ticagrelor or Effient/Prasugrel) prescribed (includes medically managed patients)? - Yes Beta Blocker prescribed? - Yes High Intensity Statin (Lipitor 40-80mg  or Crestor 20-40mg ) prescribed? - Yes EF assessed during THIS hospitalization? - Yes For EF <40%, was ACEI/ARB prescribed? - Yes For EF <40%, Aldosterone Antagonist (Spironolactone or Eplerenone) prescribed? - No, consider as an outpatient Cardiac Rehab Phase II ordered (Included Medically managed Patients)? - Yes   Outstanding Labs/Studies   FLP/LFTs in 8 weeks, BMET at follow up appt. Echo in 8 weeks.   Duration of Discharge Encounter   Greater than 30 minutes including physician time.  Signed, Reino Bellis NP-C 11/09/2019, 3:37 PM   Agree with note by Reino Bellis NP-C  Ms. Pitzer was admitted with an anterior STEMI.  She underwent PCI and stenting of her proximal LAD by Dr. Burt Knack.  She has a chronically occluded RCA with left-to-right collaterals.  Her EF was in the 30% range.  She is on optimal medical therapy including aspirin Brilinta and heart failure medications.  She is clinically improved.  She was fitted with a LifeVest for primary prevention given her EF and the fact that it was in the setting of an anterior infarct.  Plan for TOC 7 followed by return office visit with Dr. Burt Knack.  Lorretta Harp, M.D., [place], [facility], [place] [address] [place], [place]  [id number]  [phone] 11/09/2019 4:20 PM

## 2019-11-09 NOTE — Progress Notes (Signed)
CARDIAC REHAB PHASE I   PRE:  Rate/Rhythm: 104 ST  BP:  Supine:   Sitting: 103/77  Standing:    SaO2: 95%RA  MODE:  Ambulation: 750 ft   POST:  Rate/Rhythm: 113 ST  BP:  Supine:   Sitting: 100/81  Standing:    SaO2: 95%RA [id number] Pt walked 750 ft on RA with steady gait and no CP. HR a little elevated but tolerated well. Completed education. Discussed importance of daily weights and when to call MD with weight gain, signs/symptoms of CHF, importance of low sodium diet and discussed walking for ex. Pt voiced understanding. Encouraged her to walk in hall today if not discharged. CHF booklet given due to low EF.   Graylon Good, RN BSN  11/09/2019 8:40 AM

## 2019-11-09 NOTE — Plan of Care (Signed)
  Problem: Education: Goal: Understanding of CV disease, CV risk reduction, and recovery process will improve Outcome: Progressing Goal: Individualized Educational Video(s) Outcome: Progressing   Problem: Activity: Goal: Ability to return to baseline activity level will improve Outcome: Progressing   Problem: Cardiovascular: Goal: Ability to achieve and maintain adequate cardiovascular perfusion will improve Outcome: Progressing   

## 2019-11-09 NOTE — Care Management (Addendum)
11:00 Patient provided with 30 day free and copay reduction Brilinta cards 12:00 Spoke w Dorian Pod from [employer], she sill be on unit around 1:30 to get paperwork for it and start insurance auth.  13:30 Information provided to Wyonia Hough to come fit patient for vest this evening.

## 2019-11-10 LAB — HEMOGLOBIN A1C
Hgb A1c MFr Bld: 5.3 % (ref 4.8–5.6)
Mean Plasma Glucose: 105 mg/dL

## 2019-11-12 ENCOUNTER — Telehealth (HOSPITAL_COMMUNITY): Payer: Self-pay

## 2019-11-12 NOTE — Telephone Encounter (Signed)
Pt insurance is active and benefits verified through [facility]. Co-pay $0.00, DED $3,000.00/$624.31 met, out of pocket $6,000.00/$669.08 met, co-insurance 20%. No pre-authorization required. Passport, 11/12/19 @ 8:56AM, KCM#[id number]  Will contact patient to see if she is interested in the Cardiac Rehab Program. If interested, patient will need to complete follow up appt. Once completed, patient will be contacted for scheduling upon review by the RN Navigator.

## 2019-11-12 NOTE — Telephone Encounter (Signed)
Called patient to see if she is interested in the Cardiac Rehab Program. Patient expressed interest. Explained scheduling process and went over insurance, patient verbalized understanding. Will contact patient for scheduling once f/u has been completed. 

## 2019-11-16 ENCOUNTER — Telehealth: Payer: Self-pay | Admitting: Physician Assistant

## 2019-11-16 NOTE — Progress Notes (Signed)
Cardiology Office Note:    Date:  11/17/2019   ID:  [name], DOB [birth year], MRN [id number]  PCP:  Everardo Beals, NP  Cardiologist:  Sherren Mocha, MD   Electrophysiologist:  None   Referring MD: No ref. provider found   Chief Complaint:  Hospitalization Follow-up (s/p MI >> PCI)    Patient Profile:    Rebecca Sanford is a 59 y.o. female with:   Coronary artery disease   S/p ant-sept STEMI 10/2019  PCI: DES to LAD  RCA chronically occluded   Chronic systolic CHF  Ischemic CM  Echocardiogram 10/2019: EF 30-35  Hyperlipidemia   Prior CV studies: Echocardiogram 11/07/2019 EF 30-35, ant-sept AK, Gr 1 DD, normal RVSF, trivial MR  Cardiac catheterization 11/07/2019 LAD mid 95, 80 LCx ost 50, mid 50 RCA mid 100 (CTO) w/ L-R collats EF 25-35 PCI: 2.75 x 34 mm Resolute Onyx DES to LAD  History of Present Illness:    Ms. [name] was admitted 4/11-4/13 with an anteroseptal ST elevation myocardial infarction.  Cardiac catheterization demonstrated severe, tandem mid LAD stenoses treated with a single drug-eluting stent.  There was moderate nonflow limiting disease in the LCx and chronic occlusion of the mid RCA.  EF was approximately 30-35 with akinesis of the anterolateral and entire periapical region including the inferoapex.  Echocardiogram confirmed EF 30-35%.  Given anterior MI and low EF, she was discharged on LifeVest.  Low blood pressure did limit titration of CHF medications.  She returns for posthospitalization follow-up.  She is here with her husband.  Since discharge from the hospital, she has not had chest discomfort.  She does get short of breath sometimes at the end of her walks.  She has not had orthopnea but has never like sleeping on her back.  She has not had significant leg swelling.  She is taking furosemide once.  She has not had syncope.  Her LifeVest has not alarmed her.  She had some minor epistaxis 1 day this week.     Past Medical  History:  Diagnosis Date  . Hyperlipidemia   . Hypertension   . Ischemic cardiomyopathy   . STEMI (ST elevation myocardial infarction) ([place])    PCI/DES to LAD, occluded nondominent RCA, moderate LCx disease    Current Medications: Current Meds  Medication Sig  . aspirin 81 MG EC tablet Take 1 tablet (81 mg total) by mouth daily.  Marland Kitchen atorvastatin (LIPITOR) 80 MG tablet Take 1 tablet (80 mg total) by mouth daily at 6 PM.  . carvedilol (COREG) 3.125 MG tablet Take 1 tablet (3.125 mg total) by mouth 2 (two) times daily with a meal.  . furosemide (LASIX) 20 MG tablet Take 1 tablet (20 mg total) by mouth daily as needed. For weight gain of 3lbs in a day, 5 lbs in a week or shortness of breath  . losartan (COZAAR) 25 MG tablet Take 1 tablet (25 mg total) by mouth daily.  . nitroGLYCERIN (NITROSTAT) 0.4 MG SL tablet Place 1 tablet (0.4 mg total) under the tongue every 5 (five) minutes x 3 doses as needed for chest pain.  Marland Kitchen omeprazole (PRILOSEC) 20 MG capsule Take 20 mg by mouth as needed (heartburn).   . ticagrelor (BRILINTA) 90 MG TABS tablet Take 1 tablet (90 mg total) by mouth 2 (two) times daily.     Allergies:   Codeine, Hydrocodone, Nitrofurantoin macrocrystal, and Sulfa antibiotics   Social History   Tobacco Use  . Smoking status: Never Smoker  .  Smokeless tobacco: Never Used  Substance Use Topics  . Alcohol use: Never  . Drug use: Never     Family Hx: The patient's family history includes CAD in her sister; Cancer - Other in her mother; Heart attack in her father.  ROS   EKGs/Labs/Other Test Reviewed:    EKG:  EKG is   ordered today.  The ekg ordered today demonstrates normal sinus rhythm, heart rate 70, normal axis, low voltage, nonspecific ST-T wave changes, anterior Q waves, QTC 492  Recent Labs: 11/07/2019: ALT 27 11/08/2019: Hemoglobin 15.5; Platelets 281 11/09/2019: BUN 17; Creatinine, Ser 1.10; Potassium 3.8; Sodium 140   Recent Lipid Panel Lab Results  Component  Value Date/Time   CHOL 287 (H) 11/07/2019 04:12 AM   TRIG 178 (H) 11/07/2019 04:12 AM   HDL 50 11/07/2019 04:12 AM   CHOLHDL 5.7 11/07/2019 04:12 AM   LDLCALC 201 (H) 11/07/2019 04:12 AM    Physical Exam:    VS:  BP 100/60   Pulse 70   Ht 5\' 3"  (1.6 m)   Wt 198 lb (89.8 kg)   SpO2 97%   BMI 35.07 kg/m     Wt Readings from Last 3 Encounters:  11/17/19 198 lb (89.8 kg)  11/09/19 193 lb 11.2 oz (87.9 kg)     Constitutional:      Appearance: Healthy appearance. Not in distress.  Neck:     Thyroid: No thyromegaly.     Vascular: JVD normal.  Pulmonary:     Effort: Pulmonary effort is normal.     Breath sounds: Bilateral Wheezing (faint, bibasilar insp wheezes) present.  Cardiovascular:     Normal rate. Regular rhythm. Normal S1. Normal S2.     Murmurs: There is no murmur.     Comments: R wrist without hematoma Edema:    Peripheral edema absent.  Abdominal:     Palpations: Abdomen is soft. There is no hepatomegaly.  Skin:    General: Skin is warm and dry.  Neurological:     General: No focal deficit present.     Mental Status: Alert and oriented to person, place and time.      ASSESSMENT & PLAN:    1. Acute ST elevation myocardial infarction (STEMI) of anterior wall (HCC) S/p anterior ST elevation myocardial infarction tx with DES to the LAD.  She has a chronically occluded RCA and mod non-obstructive disease in the LCx.  She is doing well without anginal symptoms.  We discussed the importance of uninterrupted dual antiplatelet Rx.  I have encouraged her to enroll in cardiac rehabilitation.  Continue ASA, Ticagrelor, Atorvastatin, Carvedilol, Losartan.  Follow up in 4 weeks.   2. Chronic systolic CHF (congestive heart failure) ([place]) 3. Shortness of breath EF 30-35 w/ Grade 1 diastolic dysfunction.  Ischemic cardiomyopathy.  NYHA 2.  Volume status seems stable.  She does note some shortness of breath with exertion but has been able to complete 15-20 minute walks.  She  has a very faint inspiratory wheeze on exam at the bases.  I have asked her to take 1 dose of Furosemide today (weight at home was 190 >> 198 today).  I will also obtain a follow up BMET today along with a BNP.  If her BNP is significantly elevated, I will change her Furosemide to daily dosing.  Continue LifeVest.  We discussed that she should not drive as long as she is wearing a LifeVest.  Her BP will not tolerate the addition of spironolactone today.  We can consider this at follow up.  Continue current dose of angiotensin receptor blocker, beta-blocker.  Plan follow up echocardiogram 90 days post MI (will order at next visit).  If EF remains < 35, she will need referral to EP for implantable ICD.  I have recommended that we complete her FMLA to cover her being out for the full 90 days.  I will complete her paperwork for her and send back to her employer.  Follow up in 4 weeks.    4. Mixed hyperlipidemia Continue high intensity statin Rx.  Arrange FU fasting Lipids and LFTs in 8 weeks.    5. Snoring She notes a hx of snoring and she is uncomfortable lying on her back.  She may have sleep apnea.  I have suggested that we consider sleep testing at some point down the road.  Will hold off for now while we titrate CHF medications and await her follow up echocardiogram.      Dispo:  Return in about 4 weeks (around 12/15/2019) for Routine Follow Up, w/ Richardson Dopp, PA-C, in person.   Medication Adjustments/Labs and Tests Ordered: Current medicines are reviewed at length with the patient today.  Concerns regarding medicines are outlined above.  Tests Ordered: Orders Placed This Encounter  Procedures  . Basic Metabolic Panel (BMET)  . Pro b natriuretic peptide (BNP)  . Lipid Profile  . Hepatic function panel  . EKG 12-Lead   Medication Changes: No orders of the defined types were placed in this encounter.   Signed, Richardson Dopp, PA-C  11/17/2019 [facility] [place], [place], [place]  [address] Phone: [phone]; Fax: [phone]

## 2019-11-16 NOTE — Telephone Encounter (Signed)
I called and spoke with patient, she is aware that it is ok for her husband to come with her tomorrow to her appointment.

## 2019-11-16 NOTE — Telephone Encounter (Signed)
   Pt called requesting to bring her husband to her appt on 11/17/19. She said she doesn't drive and have trouble remembering information. Her husband will be assisting her  Please advise

## 2019-11-17 ENCOUNTER — Encounter: Payer: Self-pay | Admitting: Physician Assistant

## 2019-11-17 ENCOUNTER — Other Ambulatory Visit: Payer: Self-pay

## 2019-11-17 ENCOUNTER — Ambulatory Visit: Payer: 59 | Admitting: Physician Assistant

## 2019-11-17 VITALS — BP 100/60 | HR 70 | Ht 63.0 in | Wt 198.0 lb

## 2019-11-17 DIAGNOSIS — I5022 Chronic systolic (congestive) heart failure: Secondary | ICD-10-CM | POA: Diagnosis not present

## 2019-11-17 DIAGNOSIS — R0683 Snoring: Secondary | ICD-10-CM

## 2019-11-17 DIAGNOSIS — I2109 ST elevation (STEMI) myocardial infarction involving other coronary artery of anterior wall: Secondary | ICD-10-CM

## 2019-11-17 DIAGNOSIS — R0602 Shortness of breath: Secondary | ICD-10-CM

## 2019-11-17 DIAGNOSIS — E782 Mixed hyperlipidemia: Secondary | ICD-10-CM

## 2019-11-17 NOTE — Patient Instructions (Signed)
Medication Instructions:  TAKE LASIX 20 mg TODAY *If you need a refill on your cardiac medications before your next appointment, please call your pharmacy*   Lab Work: TODAY BMET BNP  2 MONTHS FASTING LIPIDS/LFT  (PLEASE SCHEDULE) If you have labs (blood work) drawn today and your tests are completely normal, you will receive your results only by: Marland Kitchen MyChart Message (if you have MyChart) OR . A paper copy in the mail If you have any lab test that is abnormal or we need to change your treatment, we will call you to review the results.   Testing/Procedures: NONE   Follow-Up: At [facility], you and your health needs are our priority.  As part of our continuing mission to provide you with exceptional heart care, we have created designated Provider Care Teams.  These Care Teams include your primary Cardiologist (physician) and Advanced Practice Providers (APPs -  Physician Assistants and Nurse Practitioners) who all work together to provide you with the care you need, when you need it.    Your next appointment:   4 week(s)  The format for your next appointment:   In Person  Provider:   Richardson Dopp, PA   Other Instructions

## 2019-11-18 ENCOUNTER — Telehealth: Payer: Self-pay

## 2019-11-18 ENCOUNTER — Telehealth (HOSPITAL_COMMUNITY): Payer: Self-pay

## 2019-11-18 DIAGNOSIS — I5022 Chronic systolic (congestive) heart failure: Secondary | ICD-10-CM

## 2019-11-18 DIAGNOSIS — I255 Ischemic cardiomyopathy: Secondary | ICD-10-CM

## 2019-11-18 DIAGNOSIS — R0602 Shortness of breath: Secondary | ICD-10-CM

## 2019-11-18 LAB — BASIC METABOLIC PANEL
BUN/Creatinine Ratio: 16 (ref 9–23)
BUN: 10 mg/dL (ref 6–24)
CO2: 21 mmol/L (ref 20–29)
Calcium: 10 mg/dL (ref 8.7–10.2)
Chloride: 107 mmol/L — ABNORMAL HIGH (ref 96–106)
Creatinine, Ser: 0.62 mg/dL (ref 0.57–1.00)
GFR calc Af Amer: 116 mL/min/{1.73_m2} (ref >59)
GFR calc non Af Amer: 100 mL/min/{1.73_m2} (ref >59)
Glucose: 86 mg/dL (ref 65–99)
Potassium: 4.2 mmol/L (ref 3.5–5.2)
Sodium: 143 mmol/L (ref 134–144)

## 2019-11-18 LAB — PRO B NATRIURETIC PEPTIDE: NT-Pro BNP: 1283 pg/mL — ABNORMAL HIGH (ref 0–287)

## 2019-11-18 NOTE — Telephone Encounter (Signed)
-----   Message from Liliane Shi, PA-C sent at 11/18/2019  9:29 AM EDT ----- Creatinine, K+ normal. BNP elevated.  She is currently taking Furosemide as needed.  PLAN:   - Change Furosemide to 20 mg once daily   - BMET, NT Pro-BNP 1 week  Richardson Dopp, PA-C    11/18/2019 9:27 AM

## 2019-11-18 NOTE — Telephone Encounter (Signed)
Called pt to see if she was interested in the cardiac rehab program, pt stated that she is and that she will call back after she discusses things with her husband. Placed pt ppw in the ready to schedule bin.

## 2019-11-18 NOTE — Telephone Encounter (Signed)
The patient has been notified of the result and verbalized understanding.  All questions (if any) were answered. Patient will come back on 11/24/19 for repeat labs. Mady Haagensen, [place] 11/18/2019 2:36 PM

## 2019-11-24 ENCOUNTER — Other Ambulatory Visit: Payer: 59 | Admitting: *Deleted

## 2019-11-24 ENCOUNTER — Other Ambulatory Visit: Payer: Self-pay

## 2019-11-24 DIAGNOSIS — I255 Ischemic cardiomyopathy: Secondary | ICD-10-CM

## 2019-11-24 DIAGNOSIS — I5022 Chronic systolic (congestive) heart failure: Secondary | ICD-10-CM

## 2019-11-24 DIAGNOSIS — R0602 Shortness of breath: Secondary | ICD-10-CM

## 2019-11-25 ENCOUNTER — Telehealth: Payer: Self-pay

## 2019-11-25 LAB — BASIC METABOLIC PANEL
BUN/Creatinine Ratio: 21 (ref 9–23)
BUN: 15 mg/dL (ref 6–24)
CO2: 22 mmol/L (ref 20–29)
Calcium: 9.7 mg/dL (ref 8.7–10.2)
Chloride: 102 mmol/L (ref 96–106)
Creatinine, Ser: 0.71 mg/dL (ref 0.57–1.00)
GFR calc Af Amer: 109 mL/min/{1.73_m2} (ref >59)
GFR calc non Af Amer: 95 mL/min/{1.73_m2} (ref >59)
Glucose: 106 mg/dL — ABNORMAL HIGH (ref 65–99)
Potassium: 4.1 mmol/L (ref 3.5–5.2)
Sodium: 140 mmol/L (ref 134–144)

## 2019-11-25 LAB — PRO B NATRIURETIC PEPTIDE: NT-Pro BNP: 1070 pg/mL — ABNORMAL HIGH (ref 0–287)

## 2019-11-25 NOTE — Telephone Encounter (Signed)
-----   Message from Liliane Shi, PA-C sent at 11/25/2019 11:17 AM EDT ----- Creatinine, K+ normal.  BNP somewhat improved.  PLAN:   - Continue current dose of Lasix.  - How is her breathing now? Richardson Dopp, PA-C    11/25/2019 11:15 AM

## 2019-11-25 NOTE — Telephone Encounter (Signed)
Ok. Continue current medications.  Richardson Dopp, [place] 13:29 11/25/2019

## 2019-11-25 NOTE — Telephone Encounter (Signed)
I called and spoke with patient, she is aware to continue current medications. Patient verbalized understanding and thanked me for the call.

## 2019-11-25 NOTE — Telephone Encounter (Signed)
I called and spoke with patient, she states that her breathing has gotten better. She states that she has been walking every day and adding 1-2 minutes a day of walking. She has been adding in light house work. She does get a little short of breath at the end of the walk but it is not as bad as it was and she is not needing to stop what she is doing like she was before.

## 2019-12-05 ENCOUNTER — Other Ambulatory Visit: Payer: Self-pay | Admitting: Cardiovascular Disease

## 2019-12-14 NOTE — Progress Notes (Signed)
Cardiology Office Note:    Date:  12/15/2019   ID:  Shon Hale, DOB 08-Apr-1961, MRN [id number]  PCP:  Everardo Beals, NP  Cardiologist:  Sherren Mocha, MD   Electrophysiologist:  None   Referring MD: Everardo Beals, NP   Chief Complaint:  Follow-up (CAD, CHF)    Patient Profile:    Rebecca Sanford is a 59 y.o. female with:   Coronary artery disease   S/p ant-sept STEMI 10/2019  PCI: DES to LAD  RCA chronically occluded   Chronic systolic CHF  Ischemic CM  Echocardiogram 10/2019: EF 30-35  Hyperlipidemia   Prior CV studies: Echocardiogram 11/07/2019 EF 30-35, ant-sept AK, Gr 1 DD, normal RVSF, trivial MR  Cardiac catheterization 11/07/2019 LAD mid 95, 86 LCx ost 50, mid 7 RCA mid 100 (CTO) w/ L-R collats EF 25-35 PCI: 2.75 x 34 mm Resolute Onyx DES to LAD  History of Present Illness:    Ms. Guider was admitted in 10/2019 with an anteroseptal STEMI tx with a DES to the LAD.  The mid RCA was chronically occluded.  Here EF was 30-35 and she was DC on a LifeVest.  I saw her for follow up 11/17/2019.  She had symptoms of shortness of breath.  A BNP was significantly elevated and I increased her Furosemide.  She returns for follow up.  Her breathing is improved.  She still has some shortness of breath with certain activities.  She has not had chest pain or syncope.  She sleeps on 2 pillows.  She has not had leg swelling.  Her weights have been stable.       Past Medical History:  Diagnosis Date  . Hyperlipidemia   . Hypertension   . Ischemic cardiomyopathy   . STEMI (ST elevation myocardial infarction) ([place])    PCI/DES to LAD, occluded nondominent RCA, moderate LCx disease    Current Medications: Current Meds  Medication Sig  . aspirin 81 MG EC tablet Take 1 tablet (81 mg total) by mouth daily.  Marland Kitchen atorvastatin (LIPITOR) 80 MG tablet Take 1 tablet (80 mg total) by mouth daily at 6 PM.  . carvedilol (COREG) 3.125 MG tablet Take 1 tablet (3.125 mg  total) by mouth 2 (two) times daily with a meal.  . diphenhydramine-acetaminophen (TYLENOL PM EXTRA STRENGTH) 25-500 MG TABS tablet Take 1 tablet by mouth at bedtime as needed (for pain).  . furosemide (LASIX) 20 MG tablet Take 1 tablet (20 mg total) by mouth daily.  Marland Kitchen losartan (COZAAR) 25 MG tablet Take 1 tablet (25 mg total) by mouth daily.  . nitroGLYCERIN (NITROSTAT) 0.4 MG SL tablet Place 1 tablet (0.4 mg total) under the tongue every 5 (five) minutes x 3 doses as needed for chest pain.  Marland Kitchen omeprazole (PRILOSEC) 20 MG capsule Take 20 mg by mouth as needed (heartburn).   . ticagrelor (BRILINTA) 90 MG TABS tablet Take 1 tablet (90 mg total) by mouth 2 (two) times daily.     Allergies:   Codeine, Hydrocodone, Nitrofurantoin macrocrystal, and Sulfa antibiotics   Social History   Tobacco Use  . Smoking status: Never Smoker  . Smokeless tobacco: Never Used  Substance Use Topics  . Alcohol use: Never  . Drug use: Never     Family Hx: The patient's family history includes CAD in her sister; Cancer - Other in her mother; Heart attack in her father.  ROS   EKGs/Labs/Other Test Reviewed:    EKG:  EKG is not ordered today.  The  ekg ordered today demonstrates n/a  Recent Labs: 11/07/2019: ALT 27 11/08/2019: Hemoglobin 15.5; Platelets 281 11/24/2019: BUN 15; Creatinine, Ser 0.71; NT-Pro BNP 1,070; Potassium 4.1; Sodium 140   Recent Lipid Panel Lab Results  Component Value Date/Time   CHOL 287 (H) 11/07/2019 04:12 AM   TRIG 178 (H) 11/07/2019 04:12 AM   HDL 50 11/07/2019 04:12 AM   CHOLHDL 5.7 11/07/2019 04:12 AM   LDLCALC 201 (H) 11/07/2019 04:12 AM    Physical Exam:    VS:  BP 100/60   Pulse 68   Ht 5\' 3"  (1.6 m)   Wt 198 lb 12.8 oz (90.2 kg)   SpO2 97%   BMI 35.22 kg/m     Wt Readings from Last 3 Encounters:  12/15/19 198 lb 12.8 oz (90.2 kg)  11/17/19 198 lb (89.8 kg)  11/09/19 193 lb 11.2 oz (87.9 kg)     Constitutional:      Appearance: Healthy appearance. Not in  distress.  Neck:     Thyroid: No thyromegaly.     Vascular: JVD normal.  Pulmonary:     Effort: Pulmonary effort is normal.     Breath sounds: No wheezing. No rales.  Cardiovascular:     Normal rate. Regular rhythm. Normal S1. Normal S2.     Murmurs: There is no murmur.  Edema:    Peripheral edema absent.  Abdominal:     Palpations: Abdomen is soft. There is no hepatomegaly.  Skin:    General: Skin is warm and dry.  Neurological:     Mental Status: Alert and oriented to person, place and time.     Cranial Nerves: Cranial nerves are intact.       ASSESSMENT & PLAN:    1. Chronic systolic CHF (congestive heart failure) (HCC) EF 30-35 w/ Grade 1 diastolic dysfunction.  Ischemic cardiomyopathy.  NYHA 2. Volume status seems improved since starting on daily Furosemide.  Her BP will not tolerate the addition of Spironolactone.  Continue current dose of angiotensin receptor blocker, beta-blocker.  Obtain limited echocardiogram 90 days post MI (around 02/06/2020) to recheck EF.  If EF < 35, refer to EP for ICD.  She brings in more paperwork to complete for her FMLA.  I will complete this and send to her employer.    2. Coronary artery disease involving native coronary artery of native heart without angina pectoris S/p anterior ST elevation myocardial infarction tx with DES to the LAD.  She has a chronically occluded RCA.  She is doing well without angina.  Continue ASA, Ticagrelor, Losartan, Carvedilol, Atorvastatin.    3. Mixed hyperlipidemia Continue high intensity statin.  Fasting lipids will be obtained in several weeks.     Dispo:  Return in about 8 weeks (around 02/09/2020) for Follow up after testing, w/ Dr. Burt Knack, or Richardson Dopp, PA-C, in person.   Medication Adjustments/Labs and Tests Ordered: Current medicines are reviewed at length with the patient today.  Concerns regarding medicines are outlined above.  Tests Ordered: Orders Placed This Encounter  Procedures  .  ECHOCARDIOGRAM LIMITED   Medication Changes: No orders of the defined types were placed in this encounter.   Signed, Richardson Dopp, PA-C  12/15/2019 6:00 PM    [facility] [address], [place], [place]  [address] Phone: [phone]; Fax: [phone]

## 2019-12-15 ENCOUNTER — Encounter: Payer: Self-pay | Admitting: Physician Assistant

## 2019-12-15 ENCOUNTER — Ambulatory Visit: Payer: 59 | Admitting: Physician Assistant

## 2019-12-15 ENCOUNTER — Other Ambulatory Visit: Payer: Self-pay

## 2019-12-15 VITALS — BP 100/60 | HR 68 | Ht 63.0 in | Wt 198.8 lb

## 2019-12-15 DIAGNOSIS — E782 Mixed hyperlipidemia: Secondary | ICD-10-CM

## 2019-12-15 DIAGNOSIS — I2510 Atherosclerotic heart disease of native coronary artery without angina pectoris: Secondary | ICD-10-CM | POA: Diagnosis not present

## 2019-12-15 DIAGNOSIS — I5022 Chronic systolic (congestive) heart failure: Secondary | ICD-10-CM

## 2019-12-15 NOTE — Patient Instructions (Signed)
Medication Instructions:   Your physician recommends that you continue on your current medications as directed. Please refer to the Current Medication list given to you today.  *If you need a refill on your cardiac medications before your next appointment, please call your pharmacy*  Lab Work:  None ordered today  Testing/Procedures:  Your physician has requested that you have an echocardiogram on 02/06/20 or later. Echocardiography is a painless test that uses sound waves to create images of your heart. It provides your doctor with information about the size and shape of your heart and how well your heart's chambers and valves are working. This procedure takes approximately one hour. There are no restrictions for this procedure.  Follow-Up:  Follow up with Dr. Burt Knack or Richardson Dopp after echocardiogram

## 2019-12-23 ENCOUNTER — Telehealth: Payer: Self-pay | Admitting: Cardiovascular Disease

## 2019-12-23 NOTE — Telephone Encounter (Signed)
Patient is calling to follow up in regards to paperwork dropped off at the office during appointment on 12/15/19 with Richardson Dopp. She states the paperwork is for short-term disability.Please call

## 2019-12-24 NOTE — Telephone Encounter (Signed)
Patient is calling to follow up in regards to paperwork she is requesting to have completed by Richardson Dopp.

## 2019-12-28 NOTE — Telephone Encounter (Signed)
I dropped it off at medical records this AM to fax. Richardson Dopp, PA-C    12/28/2019 8:08 AM

## 2019-12-28 NOTE — Telephone Encounter (Signed)
Patient called to follow up on her short-term disability paperwork. I informed her it was dropped off at medical records to be faxed. Patient verbalized understanding.

## 2019-12-30 ENCOUNTER — Telehealth: Payer: Self-pay | Admitting: Physician Assistant

## 2019-12-30 NOTE — Telephone Encounter (Signed)
Non-Urgent Medical Question  Elberta Fortis  You 5 minutes ago (9:58 AM)  JC Faxed this morning.[phone].  Have a great day.   Message text      Per Janett Billow in Med Records, she faxed this paperwork this morning to contact information provided by pt and on forms.  Will inform [employer] PA-C of this, and will make the pt aware of this, via mychart message.

## 2019-12-30 NOTE — Telephone Encounter (Signed)
Rebecca Sanford is calling stating she gave Richardson Dopp her disability forms on 12/15/19 and he advised her he would give them to medical records to have them faxed to [facility]. [place] states [place] informed her they have not received the paperwork. She is requesting it be sent to the fax number written on the top of the paper and that she be called once it has been done. Please advise

## 2019-12-30 NOTE — Telephone Encounter (Signed)
FW: Non-Urgent Medical Question Received: Today Message Contents  Senaida Ores M, LPN  I dropped them off with medical records on Tuesday. Can someone check with them to make sure it gets faxed today?  Richardson Dopp, PA-C  09:39 12/30/2019

## 2020-01-16 DIAGNOSIS — R58 Hemorrhage, not elsewhere classified: Secondary | ICD-10-CM

## 2020-01-17 ENCOUNTER — Other Ambulatory Visit: Payer: 59

## 2020-01-17 ENCOUNTER — Other Ambulatory Visit: Payer: Self-pay

## 2020-01-17 NOTE — Telephone Encounter (Signed)
See note from patient. Add CBC to labs for tomorrow. I placed order in Epic already. Richardson Dopp, PA-C    01/17/2020 12:56 PM

## 2020-01-18 ENCOUNTER — Other Ambulatory Visit: Payer: 59

## 2020-01-18 ENCOUNTER — Other Ambulatory Visit: Payer: Self-pay

## 2020-01-18 DIAGNOSIS — R58 Hemorrhage, not elsewhere classified: Secondary | ICD-10-CM

## 2020-01-18 DIAGNOSIS — I2109 ST elevation (STEMI) myocardial infarction involving other coronary artery of anterior wall: Secondary | ICD-10-CM

## 2020-01-18 DIAGNOSIS — E782 Mixed hyperlipidemia: Secondary | ICD-10-CM

## 2020-01-18 DIAGNOSIS — I5022 Chronic systolic (congestive) heart failure: Secondary | ICD-10-CM

## 2020-01-18 LAB — LIPID PANEL
Chol/HDL Ratio: 3.9 ratio (ref 0.0–4.4)
Cholesterol, Total: 232 mg/dL — ABNORMAL HIGH (ref 100–199)
HDL: 59 mg/dL (ref >39)
LDL Chol Calc (NIH): 138 mg/dL — ABNORMAL HIGH (ref 0–99)
Triglycerides: 199 mg/dL — ABNORMAL HIGH (ref 0–149)
VLDL Cholesterol Cal: 35 mg/dL (ref 5–40)

## 2020-01-18 LAB — CBC
Hematocrit: 40.8 % (ref 34.0–46.6)
Hemoglobin: 14 g/dL (ref 11.1–15.9)
MCH: 30.7 pg (ref 26.6–33.0)
MCHC: 34.3 g/dL (ref 31.5–35.7)
MCV: 90 fL (ref 79–97)
Platelets: 280 10*3/uL (ref 150–450)
RBC: 4.56 x10E6/uL (ref 3.77–5.28)
RDW: 13.3 % (ref 11.7–15.4)
WBC: 7.2 10*3/uL (ref 3.4–10.8)

## 2020-01-18 LAB — HEPATIC FUNCTION PANEL
ALT: 160 IU/L — ABNORMAL HIGH (ref 0–32)
AST: 103 IU/L — ABNORMAL HIGH (ref 0–40)
Albumin: 4.4 g/dL (ref 3.8–4.9)
Alkaline Phosphatase: 298 IU/L — ABNORMAL HIGH (ref 48–121)
Bilirubin Total: 0.6 mg/dL (ref 0.0–1.2)
Bilirubin, Direct: 0.22 mg/dL (ref 0.00–0.40)
Total Protein: 7 g/dL (ref 6.0–8.5)

## 2020-01-31 ENCOUNTER — Other Ambulatory Visit: Payer: Self-pay | Admitting: Cardiovascular Disease

## 2020-01-31 ENCOUNTER — Other Ambulatory Visit: Payer: Self-pay | Admitting: Physician Assistant

## 2020-02-08 ENCOUNTER — Encounter: Payer: Self-pay | Admitting: Physician Assistant

## 2020-02-08 ENCOUNTER — Ambulatory Visit (HOSPITAL_COMMUNITY): Payer: 59 | Attending: Cardiology

## 2020-02-08 ENCOUNTER — Other Ambulatory Visit: Payer: Self-pay

## 2020-02-08 DIAGNOSIS — I5022 Chronic systolic (congestive) heart failure: Secondary | ICD-10-CM | POA: Diagnosis present

## 2020-02-09 ENCOUNTER — Other Ambulatory Visit: Payer: Self-pay

## 2020-02-09 DIAGNOSIS — I5022 Chronic systolic (congestive) heart failure: Secondary | ICD-10-CM

## 2020-02-09 NOTE — Progress Notes (Signed)
Referral to EP started.

## 2020-02-14 NOTE — Progress Notes (Signed)
Virtual Visit via Video Note   This visit type was conducted due to national recommendations for restrictions regarding the COVID-19 Pandemic (e.g. social distancing) in an effort to limit this patient's exposure and mitigate transmission in our community.  Due to her co-morbid illnesses, this patient is at least at moderate risk for complications without adequate follow up.  This format is felt to be most appropriate for this patient at this time.  All issues noted in this document were discussed and addressed.  A limited physical exam was performed with this format.  Please refer to the patient's chart for her consent to telehealth for [facility].     Date:  02/15/2020   ID:  Rebecca Sanford, DOB 05-19-1961, MRN [id number]   The patient was identified using 2 identifiers.  Patient Location: Home Provider Location: Office/Clinic  PCP:  Everardo Beals, NP  Cardiologist:  Sherren Mocha, MD   Electrophysiologist:  None   Evaluation Performed:  Follow-Up Visit  Chief Complaint:  CAD, CHF  Patient Profile: Rebecca Sanford is a 59 y.o. female with:  Coronary artery disease   S/p ant-sept STEMI 10/2019  PCI: DES to LAD  DC on LifeVest  RCA chronically occluded   Chronic systolic CHF  Ischemic CM  Echocardiogram 10/2019: EF 30-35  Echocardiogram 02/08/20: EF 60-65  Hyperlipidemia   Prior CV studies: Echocardiogram 02/08/20 EF 60-65, no RWMA, Gr 1 DD, normal RVSF  Echocardiogram 11/07/2019 EF 30-35, ant-sept AK, Gr 1 DD, normal RVSF, trivial MR  Cardiac catheterization 11/07/2019 LAD mid 95, 80 LCx ost 50, mid 50 RCA mid 100 (CTO) w/ L-R collats EF 25-35 PCI: 2.75 x 34 mm Resolute Onyx DES to LAD    History of Present Illness:   Rebecca Sanford was last seen in 11/2019.  Since then, her follow up echocardiogram demonstrated improved LVF with normal EF. Her LifeVest was DC'd.  Of note, she had elevated LFTs on repeat labs.  Her statin has been held and she  was sent back to primary care for evaluation.  She returns for follow up.   She was to be seen in person today.  But, she has had a lot of respiratory symptoms recently and has a COVID-19 test pending.  So, we changed her to virtual.  We tried to connect via video, but connection was lost <50% into the encounter and we changed to audio only.    She has been short of breath.  Her PCP did a CXR and she was dx with pneumonia.  She had no improvement and a repeat CXR is pending.  She has been placed on antibiotics and prednisone.  She started this yesterday and is starting to feel better today.  She has not had chest pain.  She sleeps on 2 pillows. She has not had sudden weight gain or swelling.  She has not had syncope.    Past Medical History:  Diagnosis Date  . Hyperlipidemia   . Hypertension   . Ischemic cardiomyopathy w Improved EF    EF 35% at time of MI in 10/2019 // Echo 01/2020: EF 60-65, GR 1 DD, normal RV SF  . STEMI (ST elevation myocardial infarction) (HCC)    PCI/DES to LAD, occluded nondominent RCA, moderate LCx disease   Past Surgical History:  Procedure Laterality Date  . APPENDECTOMY    . CORONARY/GRAFT ACUTE MI REVASCULARIZATION N/A 11/07/2019   Procedure: Coronary/Graft Acute MI Revascularization;  Surgeon: Sherren Mocha, MD;  Location: [facility];  Service: Cardiovascular;  Laterality: N/A;  . LEFT HEART CATH AND CORONARY ANGIOGRAPHY N/A 11/07/2019   Procedure: LEFT HEART CATH AND CORONARY ANGIOGRAPHY;  Surgeon: Sherren Mocha, MD;  Location: [facility];  Service: Cardiovascular;  Laterality: N/A;     Current Meds  Medication Sig  . albuterol (VENTOLIN HFA) 108 (90 Base) MCG/ACT inhaler 3 (three) times daily.  Marland Kitchen aspirin 81 MG EC tablet Take 1 tablet (81 mg total) by mouth daily.  . carvedilol (COREG) 3.125 MG tablet TAKE 1 TABLET(3.125 MG) BY MOUTH TWICE DAILY WITH A MEAL  . cefdinir (OMNICEF) 300 MG capsule Take 300 mg by mouth 2 (two) times daily.  .  diphenhydramine-acetaminophen (TYLENOL PM EXTRA STRENGTH) 25-500 MG TABS tablet Take 1 tablet by mouth at bedtime as needed (for pain).  Marland Kitchen doxycycline (VIBRAMYCIN) 100 MG capsule Take 100 mg by mouth 2 (two) times daily.  . furosemide (LASIX) 20 MG tablet TAKE 1 TABLET(20 MG) BY MOUTH DAILY  . losartan (COZAAR) 25 MG tablet Take 1 tablet (25 mg total) by mouth daily.  . methylPREDNISolone (MEDROL DOSEPAK) 4 MG TBPK tablet See admin instructions. follow package directions  . nitroGLYCERIN (NITROSTAT) 0.4 MG SL tablet Place 1 tablet (0.4 mg total) under the tongue every 5 (five) minutes x 3 doses as needed for chest pain.  Marland Kitchen omeprazole (PRILOSEC) 20 MG capsule Take 20 mg by mouth as needed (heartburn).   . rosuvastatin (CRESTOR) 40 MG tablet Take 40 mg by mouth daily.  . ticagrelor (BRILINTA) 90 MG TABS tablet Take 1 tablet (90 mg total) by mouth 2 (two) times daily.     Allergies:   Codeine, Hydrocodone, Nitrofurantoin macrocrystal, and Sulfa antibiotics   Social History   Tobacco Use  . Smoking status: Never Smoker  . Smokeless tobacco: Never Used  Vaping Use  . Vaping Use: Never used  Substance Use Topics  . Alcohol use: Never  . Drug use: Never     Family Hx: The patient's family history includes CAD in her sister; Cancer - Other in her mother; Heart attack in her father.  ROS:   Please see the history of present illness.     Labs/Other Tests and Data Reviewed:    EKG:  No ECG reviewed.  Recent Labs: 11/24/2019: BUN 15; Creatinine, Ser 0.71; NT-Pro BNP 1,070; Potassium 4.1; Sodium 140 01/18/2020: ALT 160; Hemoglobin 14.0; Platelets 280   Recent Lipid Panel Lab Results  Component Value Date/Time   CHOL 232 (H) 01/18/2020 09:13 AM   TRIG 199 (H) 01/18/2020 09:13 AM   HDL 59 01/18/2020 09:13 AM   CHOLHDL 3.9 01/18/2020 09:13 AM   CHOLHDL 5.7 11/07/2019 04:12 AM   LDLCALC 138 (H) 01/18/2020 09:13 AM    Wt Readings from Last 3 Encounters:  02/15/20 199 lb 9.6 oz (90.5  kg)  12/15/19 198 lb 12.8 oz (90.2 kg)  11/17/19 198 lb (89.8 kg)     Objective:    Vital Signs:  BP 123/82   Pulse 82   Ht 5\' 3"  (1.6 m)   Wt 199 lb 9.6 oz (90.5 kg)   SpO2 97%   BMI 35.36 kg/m    VITAL SIGNS:  reviewed GEN:  no acute distress RESPIRATORY:  no labored breathing noted  ASSESSMENT & PLAN:    1. Chronic systolic CHF (congestive heart failure) ([place]) 2. Ischemic cardiomyopathy EF has returned to normal.  She has DC'd her Lifevest. Continue carvedilol, losartan. It does not sound like her current symptoms are due  to heart failure. She seems to have some type of respiratory illness. I would continue her current dose of furosemide for now. She dropped off more paperwork today for her disability. We had kept her out of work while she was wearing a LifeVest. I will allow her to return on February 22, 2020. If she requires any further absence from work related to her current respiratory illness, she will need to have that completed by primary care. Follow-up in 6 months.  3. Coronary artery disease involving native coronary artery of native heart without angina pectoris S/p anterior ST elevation myocardial infarction in 10/2019 tx with DES to the LAD.  She has a chronically occluded RCA. She is not having anginal symptoms. We discussed the importance of dual antiplatelet therapy for minimum of 1 year post ACS. Continue aspirin, atorvastatin, carvedilol, losartan, ticagrelor.  4. Mixed hyperlipidemia 5. Elevated LFTs She was recently taken off of atorvastatin due to elevated LFTs. Her PCP has placed her on rosuvastatin 40 mg once daily. I have asked her to have her PCP send [place] a copy of her follow-up lipids and LFTs. Goal LDL <70.   Time:   Today, I have spent 10 minutes with the patient with telehealth technology discussing the above problems.     Medication Adjustments/Labs and Tests Ordered: Current medicines are reviewed at length with the patient today.  Concerns regarding  medicines are outlined above.   Tests Ordered: No orders of the defined types were placed in this encounter.   Medication Changes: No orders of the defined types were placed in this encounter.   Follow Up:  Dr. Burt Knack or Richardson Dopp, PA-C   In Person in 6 month(s)  Signed, Richardson Dopp, PA-C  02/15/2020 4:58 PM    [place]

## 2020-02-15 ENCOUNTER — Other Ambulatory Visit: Payer: Self-pay

## 2020-02-15 ENCOUNTER — Telehealth (INDEPENDENT_AMBULATORY_CARE_PROVIDER_SITE_OTHER): Payer: 59 | Admitting: Physician Assistant

## 2020-02-15 ENCOUNTER — Encounter: Payer: Self-pay | Admitting: Physician Assistant

## 2020-02-15 VITALS — BP 123/82 | HR 82 | Ht 63.0 in | Wt 199.6 lb

## 2020-02-15 DIAGNOSIS — I5022 Chronic systolic (congestive) heart failure: Secondary | ICD-10-CM | POA: Diagnosis not present

## 2020-02-15 DIAGNOSIS — I2510 Atherosclerotic heart disease of native coronary artery without angina pectoris: Secondary | ICD-10-CM

## 2020-02-15 DIAGNOSIS — E782 Mixed hyperlipidemia: Secondary | ICD-10-CM

## 2020-02-15 DIAGNOSIS — I255 Ischemic cardiomyopathy: Secondary | ICD-10-CM

## 2020-02-15 DIAGNOSIS — R7989 Other specified abnormal findings of blood chemistry: Secondary | ICD-10-CM

## 2020-02-15 NOTE — Patient Instructions (Signed)
Medication Instructions:   Your physician recommends that you continue on your current medications as directed. Please refer to the Current Medication list given to you today.  *If you need a refill on your cardiac medications before your next appointment, please call your pharmacy*  Lab Work:  None ordered today  Testing/Procedures:  None ordered today  Follow-Up: At [facility], you and your health needs are our priority.  As part of our continuing mission to provide you with exceptional heart care, we have created designated Provider Care Teams.  These Care Teams include your primary Cardiologist (physician) and Advanced Practice Providers (APPs -  Physician Assistants and Nurse Practitioners) who all work together to provide you with the care you need, when you need it.  We recommend signing up for the patient portal called "MyChart".  Sign up information is provided on this After Visit Summary.  MyChart is used to connect with patients for Virtual Visits (Telemedicine).  Patients are able to view lab/test results, encounter notes, upcoming appointments, etc.  Non-urgent messages can be sent to your provider as well.   To learn more about what you can do with MyChart, go to [email or web address].    Your next appointment:   6 month(s)  The format for your next appointment:   In Person  Provider:   You may see Michael Cooper, MD or Scott Weaver, PA-C  

## 2020-02-28 MED ORDER — METOPROLOL SUCCINATE ER 25 MG PO TB24
12.5000 mg | ORAL_TABLET | Freq: Every day | ORAL | 3 refills | Status: DC
Start: 2020-02-28 — End: 2020-06-27

## 2020-02-28 NOTE — Telephone Encounter (Signed)
I called and spoke with patient, she is aware to discontinue Carvedilol and start Metoprolol Succinate 25 mg, 0.5 tablet by mouth once a day. Patient will continue to monitor blood pressure and send readings in 2 weeks. Patient verbalized understanding and thanked me for the call.

## 2020-02-28 NOTE — Telephone Encounter (Signed)
DC Carvedilol Start Metoprolol succinate 12.5 mg once daily Send BP readings 2 weeks after med change. Richardson Dopp, PA-C    02/28/2020 3:56 PM

## 2020-03-09 ENCOUNTER — Telehealth: Payer: Self-pay | Admitting: Cardiovascular Disease

## 2020-03-09 MED ORDER — TICAGRELOR 90 MG PO TABS: 90.0000 mg | ORAL_TABLET | Freq: Two times a day (BID) | ORAL | 11 refills | Status: DC

## 2020-03-09 NOTE — Telephone Encounter (Signed)
Pt's medication was sent to pt's pharmacy as requested. Confirmation received.  °

## 2020-03-09 NOTE — Telephone Encounter (Signed)
*  STAT* If patient is at the pharmacy, call can be transferred to refill team.   1. Which medications need to be refilled? (please list name of each medication and dose if known)  ticagrelor (BRILINTA) 90 MG TABS tablet  2. Which pharmacy/location (including street and city if local pharmacy) is medication to be sent to? [place], [place] - [address] AT [place]  3. Do they need a 30 day or 90 day supply? 30 day supply   Pt runs out of medication tomorrow.

## 2020-04-02 ENCOUNTER — Other Ambulatory Visit: Payer: Self-pay | Admitting: Cardiovascular Disease

## 2020-04-04 ENCOUNTER — Other Ambulatory Visit: Payer: Self-pay | Admitting: Cardiovascular Disease

## 2020-04-29 ENCOUNTER — Other Ambulatory Visit: Payer: Self-pay | Admitting: Cardiovascular Disease

## 2020-05-01 ENCOUNTER — Other Ambulatory Visit: Payer: Self-pay | Admitting: Physician Assistant

## 2020-06-23 ENCOUNTER — Encounter: Payer: Self-pay | Admitting: Internal Medicine

## 2020-06-23 ENCOUNTER — Other Ambulatory Visit: Payer: Self-pay

## 2020-06-23 ENCOUNTER — Ambulatory Visit: Payer: 59 | Admitting: Internal Medicine

## 2020-06-23 VITALS — BP 110/76 | HR 72 | Temp 97.3°F | Ht 63.0 in | Wt 200.1 lb

## 2020-06-23 DIAGNOSIS — J069 Acute upper respiratory infection, unspecified: Secondary | ICD-10-CM

## 2020-06-23 DIAGNOSIS — I2510 Atherosclerotic heart disease of native coronary artery without angina pectoris: Secondary | ICD-10-CM

## 2020-06-23 DIAGNOSIS — Z87891 Personal history of nicotine dependence: Secondary | ICD-10-CM

## 2020-06-23 NOTE — Progress Notes (Addendum)
Rebecca Sanford    [id number]    16-Feb-1961  Primary Care Physician:[place], Joelene Millin, NP  Referring Physician: Shanon Rosser, PA-C [place],  [place] [id number] Reason for Consultation: pneumonia and abnormal chest xray Date of Consultation: 06/23/2020  Chief complaint:   Chief Complaint  Patient presents with  . Consult    had PNA in june. abn cxr and ct scan.  wheezing     HPI: Rebecca Sanford is a 59 y.o. woman with CAD and ICM with MI in May 2021 discharged with EF 35% and life vest. Now her EF has recovered and life vest d/ced. She had a respiratory illness in June 2021 which improved with steroids and antibiotics.  She had a lot of dyspnea in the summer months and had another URI. She was seen in urgent care and referred here for abnormal breath sounds.  She notes lots of stress this year - with her mother passing away in march and her children and grandchildren moved back in with her and her husband this year.    Prior to this year she gets sick once/year in the winter/fall months. No childhood respiratory disease.   Currently she is feeling fine no chest tightness, wheezing congestion cough or dyspnea. She does have seasonal allergies but nothing too severe ( mild runny nose and sneezing.)   No orthopnea PND or LE edema. She takes furosemide daily.   She has an albuterol inhaler which she takes less than once a month. She takes it when she's sick only.   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  Exposures: lives at home with multiple family members including husband children grandchildren. No pets.  Smoking history:15 pack years, quit 2011.   Social History   Occupational History  . Occupation: Marine scientist: [employer]  Tobacco Use  . Smoking status: Former Smoker    Packs/day: 0.50    Years: 30.00    Pack years: 15.00    Types: Cigarettes    Quit date: 06/06/2010    Years  since quitting: 10.0  . Smokeless tobacco: Never Used  Vaping Use  . Vaping Use: Never used  Substance and Sexual Activity  . Alcohol use: Never  . Drug use: Never  . Sexual activity: Never    Relevant family history:  Family History  Problem Relation Age of Onset  . Cancer - Other Mother   . Heart attack Father   . CAD Sister     Past Medical History:  Diagnosis Date  . Hyperlipidemia   . Hypertension   . Ischemic cardiomyopathy w Improved EF    EF 35% at time of MI in 10/2019 // Echo 01/2020: EF 60-65, GR 1 DD, normal RV SF  . STEMI (ST elevation myocardial infarction) (HCC)    PCI/DES to LAD, occluded nondominent RCA, moderate LCx disease  . Vitamin D deficiency     Past Surgical History:  Procedure Laterality Date  . APPENDECTOMY    . CORONARY/GRAFT ACUTE MI REVASCULARIZATION N/A 11/07/2019   Procedure: Coronary/Graft Acute MI Revascularization;  Surgeon: Sherren Mocha, MD;  Location: [facility];  Service: Cardiovascular;  Laterality: N/A;  . LEFT HEART CATH AND CORONARY ANGIOGRAPHY N/A 11/07/2019   Procedure: LEFT HEART CATH AND CORONARY ANGIOGRAPHY;  Surgeon: Sherren Mocha, MD;  Location: [facility];  Service: Cardiovascular;  Laterality: N/A;     Physical Exam: Blood  pressure 110/76, pulse 72, temperature (!) 97.3 F (36.3 C), temperature source Tympanic, height 5\' 3"  (1.6 m), weight 200 lb 2 oz (90.8 kg), SpO2 96 %. Gen:      No acute distress ENT:  no nasal polyps, mucus membranes moist Lungs:    No increased respiratory effort, symmetric chest wall excursion, clear to auscultation bilaterally, no wheezes or crackles CV:         Regular rate and rhythm; no murmurs, rubs, or gallops.  No pedal edema Abd:      + bowel sounds; soft, non-tender; no distension MSK: no acute synovitis of DIP or PIP joints, no mechanics hands. Skin:      Warm and dry; no rashes Neuro: normal speech, no focal facial asymmetry Psych: alert and oriented x3, normal  mood and affect   Data Reviewed/Medical Decision Making:  Independent interpretation of tests: Imaging: . Review of patient's chest xray April 2021 images revealed no acute process. The patient's images have been independently reviewed by me.    PFTs: None on file  Labs:  Lab Results  Component Value Date   WBC 7.2 01/18/2020   HGB 14.0 01/18/2020   HCT 40.8 01/18/2020   MCV 90 01/18/2020   PLT 280 01/18/2020   Lab Results  Component Value Date   NA 140 11/24/2019   K 4.1 11/24/2019   CL 102 11/24/2019   CO2 22 11/24/2019     Immunization status:  Immunization History  Administered Date(s) Administered  . Influenza,inj,Quad PF,6-35 Mos 05/09/2020  . Moderna SARS-COVID-2 Vaccination 09/10/2019    . I reviewed prior external note(s) from urgent care . I reviewed the result(s) of the labs and imaging as noted above.    Assessment:  Recurrent URIs CAD s/p MI History of tobacco use  Plan/Recommendations: Rebecca Sanford denies any current symptoms of dyspnea or cough.  No clinical evidence of pleural effusion. We discussed that she probably has some damage to her lungs from smoking but currently her symptoms are at a minimum. Should she have increased use of her rescue inhaler or worsening symptoms or more recurrent infections, I'd want her to come back to see me and at that time discuss PFTs and potential maintenance inhaler.   She has had a couple of chest xrays done which we are not able to view. She says the technician mentioned some nodules to her but she isn't sure on what imaging.   There was also a note of a pleural effusion on outside referral, however no reports or imaging available for review. 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 She is otherwise feeling quite while so I encouraged her to keep up with activity as she is doing. I asked her to bring me records of  any imaging studies she had done outside this system and I am happy to review these and advise further.   Return to Care: Return if symptoms worsen or fail to improve.  Lenice Llamas, MD [facility] [place]  CC: [place], [place], [place]

## 2020-06-23 NOTE — Patient Instructions (Signed)
The patient should have follow up as needed.  Come back and see me if your breathing gets worse and we can discuss breathing testing.

## 2020-06-25 ENCOUNTER — Other Ambulatory Visit: Payer: Self-pay | Admitting: Physician Assistant

## 2020-08-03 ENCOUNTER — Other Ambulatory Visit: Payer: Self-pay | Admitting: Cardiology

## 2020-08-03 MED ORDER — TICAGRELOR 90 MG PO TABS: 90.0000 mg | ORAL_TABLET | Freq: Two times a day (BID) | ORAL | 6 refills | Status: DC

## 2020-08-15 ENCOUNTER — Ambulatory Visit: Payer: 59 | Admitting: Physician Assistant

## 2020-08-30 ENCOUNTER — Telehealth: Payer: Self-pay | Admitting: Internal Medicine

## 2020-08-30 NOTE — Telephone Encounter (Signed)
Routing to [place] to see if she can help since this is involving referral. Thanks!

## 2020-09-04 NOTE — Telephone Encounter (Signed)
Called our billing office who confirmed a referral was needed by [facility] for her visit with Dr. Shearon Stalls on 06/23/2020. Called referring doctor's office [facility] and left message with their referral department to follow up and see a retroactive referral can be done with [facility] as this was their responsibility. Called and left messages on both numbers left by patient advising of this. -pr

## 2020-09-28 NOTE — Progress Notes (Signed)
Cardiology Office Note:    Date:  09/29/2020   ID:  Shon Hale, DOB 1961-06-22, MRN [id number]  PCP:  Everardo Beals, NP   [place]  Cardiologist:  Sherren Mocha, MD   Advanced Practice Provider:  Liliane Shi, PA-C Electrophysiologist:  None       Referring MD: Everardo Beals, NP   Chief Complaint:  Follow-up (CAD)    Patient Profile:    Rebecca Sanford is a 60 y.o. female with:   Coronary artery disease  ? S/p ant-sept STEMI 10/2019  PCI: DES to LAD  DC on LifeVest ? RCA chronically occluded   HFimpEF (heart failure with improved ejection fraction)  ? Ischemic CM ? Echocardiogram 10/2019: EF 30-35 ? Echocardiogram 02/08/20: EF 60-65  Hyperlipidemia   Prior CV studies: Echocardiogram 02/08/20 EF 60-65, no RWMA, Gr 1 DD, normal RVSF  Echocardiogram 11/07/2019 EF 30-35, ant-sept AK, Gr 1 DD, normal RVSF, trivial MR  Cardiac catheterization 11/07/2019 LAD mid 95, 80 LCx ost 50, mid 50 RCA mid 100 (CTO) w/ L-R collats EF 25-35 PCI: 2.75 x 34 mm Resolute Onyx DES to LAD  History of Present Illness:    [name] was last seen via telemedicine in 01/2020.  She returns for follow-up.  She is here alone.  Over the past week or so, she has been having L sided chest pain described as pressure.  She works at [employer]. She notices it more when she lifts things and feels like she may have pulled a muscle.  She has not had any associated shortness of breath, arm or jaw pain, nausea or diaphoresis.  She has not had syncope, leg edema.    Past Medical History:  Diagnosis Date  . Hyperlipidemia   . Hypertension   . Ischemic cardiomyopathy w Improved EF    EF 35% at time of MI in 10/2019 // Echo 01/2020: EF 60-65, GR 1 DD, normal RV SF  . STEMI (ST elevation myocardial infarction) (HCC)    PCI/DES to LAD, occluded nondominent RCA, moderate LCx disease  . Vitamin D deficiency     Current Medications: Current Meds   Medication Sig  . acetaminophen (TYLENOL) 500 MG tablet Take 500 mg by mouth every 8 (eight) hours as needed.  Marland Kitchen albuterol (VENTOLIN HFA) 108 (90 Base) MCG/ACT inhaler Inhale 1-2 puffs into the lungs as needed for wheezing or shortness of breath.  . ASPIRIN LOW DOSE 81 MG EC tablet TAKE 1 TABLET(81 MG) BY MOUTH DAILY  . furosemide (LASIX) 20 MG tablet TAKE 1 TABLET(20 MG) BY MOUTH DAILY  . isosorbide mononitrate (IMDUR) 30 MG 24 hr tablet Take 0.5 tablets (15 mg total) by mouth daily.  Marland Kitchen losartan (COZAAR) 25 MG tablet TAKE 1 TABLET(25 MG) BY MOUTH DAILY  . metoprolol succinate (TOPROL-XL) 25 MG 24 hr tablet TAKE 1/2 TABLET(12.5 MG) BY MOUTH DAILY  . nitroGLYCERIN (NITROSTAT) 0.4 MG SL tablet PLACE 1 TABLET UNDER TONGUE EVERY 5 MINUTES FOR 3 DOSES AS NEEDED FOR CHEST PAIN  . omeprazole (PRILOSEC) 20 MG capsule Take 20 mg by mouth as needed (heartburn).   . rosuvastatin (CRESTOR) 40 MG tablet Take 40 mg by mouth daily.  . ticagrelor (BRILINTA) 90 MG TABS tablet Take 1 tablet (90 mg total) by mouth 2 (two) times daily.     Allergies:   Codeine, Hydrocodone, Nitrofurantoin macrocrystal, and Sulfa antibiotics   Social History   Tobacco Use  . Smoking status: Former Smoker    Packs/day:  0.50    Years: 30.00    Pack years: 15.00    Types: Cigarettes    Quit date: 06/06/2010    Years since quitting: 10.3  . Smokeless tobacco: Never Used  Vaping Use  . Vaping Use: Never used  Substance Use Topics  . Alcohol use: Never  . Drug use: Never     Family Hx: The patient's family history includes CAD in her sister; Cancer - Other in her mother; Heart attack in her father.  ROS   EKGs/Labs/Other Test Reviewed:    EKG:  EKG is   ordered today.  The ekg ordered today demonstrates normal sinus rhythm, heart rate 69, normal axis, low voltage, QTC 439, nonspecific ST-T wave change  Recent Labs: 11/24/2019: BUN 15; Creatinine, Ser 0.71; NT-Pro BNP 1,070; Potassium 4.1; Sodium 140 01/18/2020: ALT  160; Hemoglobin 14.0; Platelets 280   Recent Lipid Panel Lab Results  Component Value Date/Time   CHOL 232 (H) 01/18/2020 09:13 AM   TRIG 199 (H) 01/18/2020 09:13 AM   HDL 59 01/18/2020 09:13 AM   CHOLHDL 3.9 01/18/2020 09:13 AM   CHOLHDL 5.7 11/07/2019 04:12 AM   LDLCALC 138 (H) 01/18/2020 09:13 AM      Risk Assessment/Calculations:      Physical Exam:    VS:  BP 122/62   Pulse 69   Ht $R'5\' 3"'sq$  (1.6 m)   Wt 199 lb (90.3 kg)   SpO2 98%   BMI 35.25 kg/m     Wt Readings from Last 3 Encounters:  09/29/20 199 lb (90.3 kg)  06/23/20 200 lb 2 oz (90.8 kg)  02/15/20 199 lb 9.6 oz (90.5 kg)     Constitutional:      Appearance: Healthy appearance. Not in distress.  Neck:     Vascular: No JVR. JVD normal.  Pulmonary:     Effort: Pulmonary effort is normal.     Breath sounds: No wheezing. No rales.  Cardiovascular:     Normal rate. Regular rhythm. Normal S1. Normal S2.     Murmurs: There is no murmur.  Edema:    Peripheral edema absent.  Abdominal:     Palpations: Abdomen is soft. There is no hepatomegaly.  Skin:    General: Skin is warm and dry.  Neurological:     Mental Status: Alert and oriented to person, place and time.     Cranial Nerves: Cranial nerves are intact.       ASSESSMENT & PLAN:    1. Coronary artery disease involving native coronary artery of native heart with angina pectoris (HCC) 2. Chest pain, unspecified type History of anterior STEMI in 4/21 treated with a DES to the LAD.  She has moderate disease in the LCx and a chronically occluded RCA.  Over the past couple weeks, she has noted some vague chest discomfort on the left.  This seems to occur with activities and does not occur at rest.  She has not tried any nitroglycerin.  It does not remind her of her previous angina.  I have recommended proceeding with stress testing to further evaluate.  I have also recommended starting on long-acting nitrates to see if this helps her symptoms.  -Arrange  Lexiscan Myoview  -Continue aspirin, ticagrelor, metoprolol succinate, losartan, rosuvastatin  -Add isosorbide mononitrate 15 mg daily  -Return sooner if symptoms worsen  -Follow-up 3-4 weeks  3. HFimpEF (heart failure with improved EF) EF was previously 30-35.  This improved to normal.  NYHA II.  Volume status is  stable.  Continue current dose of losartan, metoprolol succinate, furosemide.  Obtain follow-up CMET today.  4. Mixed hyperlipidemia Continue high intensity statin therapy.  Obtain follow-up lipids and CMET today.  Shared Decision Making/Informed Consent The risks [chest pain, shortness of breath, cardiac arrhythmias, dizziness, blood pressure fluctuations, myocardial infarction, stroke/transient ischemic attack, nausea, vomiting, allergic reaction, radiation exposure, metallic taste sensation and life-threatening complications (estimated to be 1 in 10,000)], benefits (risk stratification, diagnosing coronary artery disease, treatment guidance) and alternatives of a nuclear stress test were discussed in detail with Ms. Bartl and she agrees to proceed.      Dispo:  Return in about 4 weeks (around 10/27/2020) for Richardson Dopp, PA with Dr. Burt Knack in office..   Medication Adjustments/Labs and Tests Ordered: Current medicines are reviewed at length with the patient today.  Concerns regarding medicines are outlined above.  Tests Ordered: Orders Placed This Encounter  Procedures  . Comp Met (CMET)  . Hepatic function panel  . Cardiac Stress Test: Informed Consent Details: Physician/Practitioner Attestation; Transcribe to consent form and obtain patient signature  . Myocardial Perfusion Imaging  . EKG 12-Lead   Medication Changes: Meds ordered this encounter  Medications  . isosorbide mononitrate (IMDUR) 30 MG 24 hr tablet    Sig: Take 0.5 tablets (15 mg total) by mouth daily.    Dispense:  45 tablet    Refill:  3    Signed, Richardson Dopp, PA-C  09/29/2020 9:59 AM    [facility] [place], [place], [place]  [address] Phone: [phone]; Fax: [phone]

## 2020-09-29 ENCOUNTER — Encounter: Payer: Self-pay | Admitting: Physician Assistant

## 2020-09-29 ENCOUNTER — Ambulatory Visit: Payer: 59 | Admitting: Physician Assistant

## 2020-09-29 ENCOUNTER — Other Ambulatory Visit: Payer: Self-pay

## 2020-09-29 VITALS — BP 122/62 | HR 69 | Ht 63.0 in | Wt 199.0 lb

## 2020-09-29 DIAGNOSIS — E782 Mixed hyperlipidemia: Secondary | ICD-10-CM

## 2020-09-29 DIAGNOSIS — I25119 Atherosclerotic heart disease of native coronary artery with unspecified angina pectoris: Secondary | ICD-10-CM

## 2020-09-29 DIAGNOSIS — I5020 Unspecified systolic (congestive) heart failure: Secondary | ICD-10-CM | POA: Diagnosis not present

## 2020-09-29 DIAGNOSIS — R079 Chest pain, unspecified: Secondary | ICD-10-CM | POA: Diagnosis not present

## 2020-09-29 DIAGNOSIS — I2510 Atherosclerotic heart disease of native coronary artery without angina pectoris: Secondary | ICD-10-CM

## 2020-09-29 LAB — COMPREHENSIVE METABOLIC PANEL
ALT: 24 IU/L (ref 0–32)
AST: 17 IU/L (ref 0–40)
Albumin/Globulin Ratio: 1.8 (ref 1.2–2.2)
Albumin: 4.2 g/dL (ref 3.8–4.9)
Alkaline Phosphatase: 120 IU/L (ref 44–121)
BUN/Creatinine Ratio: 18 (ref 9–23)
BUN: 13 mg/dL (ref 6–24)
Bilirubin Total: 0.4 mg/dL (ref 0.0–1.2)
CO2: 21 mmol/L (ref 20–29)
Calcium: 9.3 mg/dL (ref 8.7–10.2)
Chloride: 105 mmol/L (ref 96–106)
Creatinine, Ser: 0.73 mg/dL (ref 0.57–1.00)
Globulin, Total: 2.3 g/dL (ref 1.5–4.5)
Glucose: 87 mg/dL (ref 65–99)
Potassium: 3.9 mmol/L (ref 3.5–5.2)
Sodium: 141 mmol/L (ref 134–144)
Total Protein: 6.5 g/dL (ref 6.0–8.5)
eGFR: 95 mL/min/{1.73_m2} (ref >59)

## 2020-09-29 LAB — HEPATIC FUNCTION PANEL: Bilirubin, Direct: 0.16 mg/dL (ref 0.00–0.40)

## 2020-09-29 MED ORDER — ISOSORBIDE MONONITRATE ER 30 MG PO TB24: 15.0000 mg | ORAL_TABLET | Freq: Every day | ORAL | 3 refills | Status: DC

## 2020-09-29 NOTE — Patient Instructions (Signed)
Medication Instructions:  Your physician has recommended you make the following change in your medication:  1. Start imdur one half tablet ( 15 mg ) daily, sent in to requested pharmacy.   *If you need a refill on your cardiac medications before your next appointment, please call your pharmacy*   Lab Work: Your physician recommends that you  lab work today: cmet/lft.  If you have labs (blood work) drawn today and your tests are completely normal, you will receive your results only by: Marland Kitchen MyChart Message (if you have MyChart) OR . A paper copy in the mail If you have any lab test that is abnormal or we need to change your treatment, we will call you to review the results.   Testing/Procedures: Your physician has requested that you have a lexiscan myoview. For further information please visit [email or web address]. Please follow instruction sheet, as given.  You are scheduled for a Myocardial Perfusion Imaging Study on Wednesday, March 30 at 7:15 am..   Please arrive 15 minutes prior to your appointment time for registration and insurance purposes.   The test will take approximately 3 to 4 hours to complete; you may bring reading material. If someone comes with you to your appointment, they will need to remain in the main lobby due to limited space in the testing area.   If you are pregnant or breastfeeding, please notify the nuclear lab prior to your appointment.   How to prepare for your Myocardial Perfusion test:   Do not eat or drink 3 hours prior to your test, except you may have water.    Do not consume products containing caffeine (regular or decaffeinated) 12 hours prior to your test (ex: coffee, chocolate, soda, tea)   Do bring a list of your current medications with you. If not listed below, you may take your medications as normal.     Bring any held medication to your appointment, as you may be required to take it once the test is complete.   Do wear comfortable clothes  (no dresses or overalls) and walking shoes. Tennis shoes are preferred. No heels or open toed shoes.  Do not wear cologne, perfume, aftershave or lotions (deodorant is allowed).   If these instructions are not followed, you test will have to be rescheduled.   Please report to [address] for your test. If you have questions or concerns about your appointment, please call the Nuclear Lab at [phone].  If you cannot keep your appointment, please provide 24 hour notification to the Nuclear lab to avoid a possible $50 charge to your account.        Follow-Up: At [facility], you and your health needs are our priority.  As part of our continuing mission to provide you with exceptional heart care, we have created designated Provider Care Teams.  These Care Teams include your primary Cardiologist (physician) and Advanced Practice Providers (APPs -  Physician Assistants and Nurse Practitioners) who all work together to provide you with the care you need, when you need it.  We recommend signing up for the patient portal called "MyChart".  Sign up information is provided on this After Visit Summary.  MyChart is used to connect with patients for Virtual Visits (Telemedicine).  Patients are able to view lab/test results, encounter notes, upcoming appointments, etc.  Non-urgent messages can be sent to your provider as well.   To learn more about what you can do with MyChart, go to [email or web address].  Your next appointment:   3 week(s)  The format for your next appointment:   In Person  Provider:   You may see Sherren Mocha, MD or one of the following Advanced Practice Providers on your designated Care Team:    Richardson Dopp, PA-C  Robbie Lis, [place]    Other Instructions

## 2020-10-04 LAB — LIPID PANEL W/O CHOL/HDL RATIO
Cholesterol, Total: 158 mg/dL (ref 100–199)
HDL: 50 mg/dL (ref >39)
LDL Chol Calc (NIH): 84 mg/dL (ref 0–99)
Triglycerides: 135 mg/dL (ref 0–149)
VLDL Cholesterol Cal: 24 mg/dL (ref 5–40)

## 2020-10-04 LAB — SPECIMEN STATUS REPORT

## 2020-10-05 ENCOUNTER — Telehealth: Payer: Self-pay | Admitting: *Deleted

## 2020-10-05 DIAGNOSIS — I25119 Atherosclerotic heart disease of native coronary artery with unspecified angina pectoris: Secondary | ICD-10-CM

## 2020-10-05 DIAGNOSIS — E782 Mixed hyperlipidemia: Secondary | ICD-10-CM

## 2020-10-05 MED ORDER — EZETIMIBE 10 MG PO TABS: 10.0000 mg | ORAL_TABLET | Freq: Every day | ORAL | 3 refills | Status: DC

## 2020-10-05 NOTE — Telephone Encounter (Signed)
-----   Message from Liliane Shi, [place] sent at 10/04/2020  5:40 PM EST ----- LDL improved but still above goal.  Goal is <70. PLAN:  -Continue current dose of rosuvastatin -Add ezetimibe 10 mg daily -Lipids, LFTs 3 months Richardson Dopp, PA-C    10/04/2020 5:38 PM

## 2020-10-18 ENCOUNTER — Telehealth (HOSPITAL_COMMUNITY): Payer: Self-pay | Admitting: *Deleted

## 2020-10-18 NOTE — Telephone Encounter (Signed)
Left message on voicemail per DPR in reference to upcoming appointment scheduled on 10/25/21 at 0715 with detailed instructions given per Myocardial Perfusion Study Information Sheet for the test. LM to arrive 15 minutes early, and that it is imperative to arrive on time for appointment to keep from having the test rescheduled. If you need to cancel or reschedule your appointment, please call the office within 24 hours of your appointment. Failure to do so may result in a cancellation of your appointment, and a $50 no show fee. Phone number given for call back for any questions. Ramon Zanders, [place]

## 2020-10-25 ENCOUNTER — Ambulatory Visit (HOSPITAL_COMMUNITY): Payer: 59 | Attending: Cardiology

## 2020-10-25 ENCOUNTER — Other Ambulatory Visit: Payer: Self-pay

## 2020-10-25 DIAGNOSIS — R079 Chest pain, unspecified: Secondary | ICD-10-CM | POA: Diagnosis present

## 2020-10-25 LAB — MYOCARDIAL PERFUSION IMAGING
LV dias vol: 53 mL (ref 46–106)
LV sys vol: 11 mL
Peak HR: 95 {beats}/min
Rest HR: 66 {beats}/min
SDS: 2
SRS: 0
SSS: 2
TID: 1

## 2020-10-25 MED ORDER — TECHNETIUM TC 99M TETROFOSMIN IV KIT
31.6000 | PACK | Freq: Once | INTRAVENOUS | Status: AC | PRN
  Administered 2020-10-25: 31.6 via INTRAVENOUS
  Filled 2020-10-25: qty 32

## 2020-10-25 MED ORDER — REGADENOSON 0.4 MG/5ML IV SOLN
0.4000 mg | Freq: Once | INTRAVENOUS | Status: AC
  Administered 2020-10-25: 0.4 mg via INTRAVENOUS

## 2020-10-25 MED ORDER — TECHNETIUM TC 99M TETROFOSMIN IV KIT
10.9000 | PACK | Freq: Once | INTRAVENOUS | Status: AC | PRN
  Administered 2020-10-25: 10.9 via INTRAVENOUS
  Filled 2020-10-25: qty 11

## 2020-10-26 ENCOUNTER — Encounter: Payer: Self-pay | Admitting: Physician Assistant

## 2020-10-27 ENCOUNTER — Telehealth: Payer: Self-pay | Admitting: Internal Medicine

## 2020-10-27 NOTE — Telephone Encounter (Signed)
Routing to [place] since this is for the ov

## 2020-10-30 NOTE — Telephone Encounter (Signed)
Shawn husband is checking on the billing for office visit 06/23/2020. Shawn phone number is [phone].

## 2020-10-31 NOTE — Progress Notes (Signed)
Cardiology Office Note:    Date:  11/01/2020   ID:  Shon Hale, DOB [birth year], MRN [id number]  PCP:  Everardo Beals, NP   [place]  Cardiologist:  Sherren Mocha, MD   Advanced Practice Provider:  Liliane Shi, PA-C Electrophysiologist:  None       Referring MD: Everardo Beals, NP   Chief Complaint:  Follow-up (CAD, chest pain )    Patient Profile:     Rebecca Sanford is a 60 y.o. female with:   Coronary artery disease  ? S/p ant-sept STEMI 10/2019  PCI: DES to LAD  DC on LifeVest ? RCA chronically occluded   HFimpEF (heart failure with improved ejection fraction)  ? Ischemic CM ? Echocardiogram 10/2019: EF 30-35 ? Echocardiogram 02/08/20: EF 60-65  Hyperlipidemia   Prior CV studies: Myoview 10/25/20 EF 79, no ischemia, low risk  Echocardiogram 02/08/20 EF 60-65, no RWMA, Gr 1 DD, normal RVSF  Echocardiogram 11/07/2019 EF 30-35, ant-sept AK, Gr 1 DD, normal RVSF, trivial MR  Cardiac catheterization 11/07/2019 LAD mid 95, 80 LCx ost 50, mid 50 RCA mid 100 (CTO) w/ L-R collats EF 25-35 PCI: 2.75 x 34 mm Resolute Onyx DES to LAD       History of Present Illness:    Ms. Blackstock was last seen 09/29/20.  She was having some chest discomfort.  I placed her on Isosorbide and obtained a Myoview.  This was low risk and neg for ischemia with a normal EF. She returns for f/u.  She is here alone.  She did not really notice any improvement with isosorbide.  She still has occasional episodes of left-sided chest discomfort.  This is tender to the touch and seems to be more musculoskeletal than anything else.  We reviewed the findings on her stress test.  She has chronic shortness of breath with some activities.  She has not had orthopnea or leg edema.  She has not had syncope.  She has extended family living with her now including grandchildren and remains quite busy with her family and at work.  I did ask her to try to add in  regular walking on a daily basis.     Past Medical History:  Diagnosis Date  . CAD (coronary artery disease)    STEMI 4/21 >> PCI/DES to LAD, occluded nondominent RCA, moderate LCx disease // Myoview 3/22: EF 79, no ischemia or infarction; low risk   . Hyperlipidemia   . Hypertension   . Ischemic cardiomyopathy w Improved EF    EF 35% at time of MI in 10/2019 // Echo 01/2020: EF 60-65, GR 1 DD, normal RV SF  . Vitamin D deficiency     Current Medications: Current Meds  Medication Sig  . acetaminophen (TYLENOL) 500 MG tablet Take 500 mg by mouth every 8 (eight) hours as needed.  Marland Kitchen albuterol (VENTOLIN HFA) 108 (90 Base) MCG/ACT inhaler Inhale 1-2 puffs into the lungs as needed for wheezing or shortness of breath.  . ASPIRIN LOW DOSE 81 MG EC tablet TAKE 1 TABLET(81 MG) BY MOUTH DAILY  . ezetimibe (ZETIA) 10 MG tablet Take 1 tablet (10 mg total) by mouth daily.  . furosemide (LASIX) 20 MG tablet TAKE 1 TABLET(20 MG) BY MOUTH DAILY  . losartan (COZAAR) 25 MG tablet TAKE 1 TABLET(25 MG) BY MOUTH DAILY  . metoprolol succinate (TOPROL-XL) 25 MG 24 hr tablet TAKE 1/2 TABLET(12.5 MG) BY MOUTH DAILY  . nitroGLYCERIN (NITROSTAT) 0.4 MG SL tablet  PLACE 1 TABLET UNDER TONGUE EVERY 5 MINUTES FOR 3 DOSES AS NEEDED FOR CHEST PAIN  . omeprazole (PRILOSEC) 20 MG capsule Take 20 mg by mouth as needed (heartburn).   . rosuvastatin (CRESTOR) 40 MG tablet Take 40 mg by mouth daily.  . ticagrelor (BRILINTA) 60 MG TABS tablet Take 1 tablet (60 mg total) by mouth 2 (two) times daily.  . [DISCONTINUED] isosorbide mononitrate (IMDUR) 30 MG 24 hr tablet Take 0.5 tablets (15 mg total) by mouth daily.  . [DISCONTINUED] ticagrelor (BRILINTA) 90 MG TABS tablet Take 1 tablet (90 mg total) by mouth 2 (two) times daily.     Allergies:   Codeine, Hydrocodone, Nitrofurantoin macrocrystal, and Sulfa antibiotics   Social History   Tobacco Use  . Smoking status: Former Smoker    Packs/day: 0.50    Years: 30.00    Pack  years: 15.00    Types: Cigarettes    Quit date: 06/06/2010    Years since quitting: 10.4  . Smokeless tobacco: Never Used  Vaping Use  . Vaping Use: Never used  Substance Use Topics  . Alcohol use: Never  . Drug use: Never     Family Hx: The patient's family history includes CAD in her sister; Cancer - Other in her mother; Heart attack in her father.  ROS   EKGs/Labs/Other Test Reviewed:    EKG:  EKG is not ordered today.  The ekg ordered today demonstrates n/a  Recent Labs: 11/24/2019: NT-Pro BNP 1,070 01/18/2020: Hemoglobin 14.0; Platelets 280 09/29/2020: ALT 24; BUN 13; Creatinine, Ser 0.73; Potassium 3.9; Sodium 141   Recent Lipid Panel Lab Results  Component Value Date/Time   CHOL 158 09/29/2020 10:02 AM   TRIG 135 09/29/2020 10:02 AM   HDL 50 09/29/2020 10:02 AM   CHOLHDL 3.9 01/18/2020 09:13 AM   CHOLHDL 5.7 11/07/2019 04:12 AM   LDLCALC 84 09/29/2020 10:02 AM      Risk Assessment/Calculations:      Physical Exam:    VS:  BP 140/72   Pulse 75   Ht 5\' 3"  (1.6 m)   Wt 198 lb (89.8 kg)   SpO2 98%   BMI 35.07 kg/m     Wt Readings from Last 3 Encounters:  11/01/20 198 lb (89.8 kg)  10/25/20 199 lb (90.3 kg)  09/29/20 199 lb (90.3 kg)     Constitutional:      Appearance: Healthy appearance. Not in distress.  Neck:     Vascular: No JVR. JVD normal.  Pulmonary:     Effort: Pulmonary effort is normal.     Breath sounds: Diffuse Wheezing (faint, early inspiratory) present. No rales.     Comments: Decreased breath sounds Cardiovascular:     Normal rate. Regular rhythm. Normal S1. Normal S2.     Murmurs: There is no murmur.  Edema:    Peripheral edema absent.  Abdominal:     Palpations: Abdomen is soft. There is no hepatomegaly.  Skin:    General: Skin is warm and dry.  Neurological:     Mental Status: Alert and oriented to person, place and time.     Cranial Nerves: Cranial nerves are intact.          ASSESSMENT & PLAN:    1. Coronary artery  disease involving native coronary artery of native heart without angina pectoris History of anterior STEMI in 4/21 treated with a DES to the LAD.  She has moderate disease in the LCx and a chronically occluded RCA.  Recent  Myoview was low risk and she did not have any improvement with long acting nitrates. She likely has more MSK pain than anything else.  She can DC the Isosorbide.  Give her diffuse disease, I think she would benefit from continuing on DAPT.  She prefers to remain on Brilinta.  Reduce Brilinta to 60 mg twice daily.  Continue ASA, statin, beta-blocker.  F/u in 6 mos.   2. HFimpEF (heart failure with improved EF) EF improved from 30-35 post MI to normal.  Volume status is stable.  NYHA II-IIb.  She has symptoms c/w COPD and already uses a rescue inhaler.  I suspect most of her shortness of breath is related to possible COPD.    3. Mixed hyperlipidemia Recent LDL above goal.  She is now on ezetimibe.  Plan f/u lipids and LFTs in June 2022.    4. Shortness of breath As noted, she likely has COPD.  She has a 30+ year hx of smoking.  She does not move a lot of air on exam.  I have asked her to f/u with PCP.  She may benefit from advancing her inhaled med Rx vs referral to Pulmonology.      Dispo:  Return in about 6 months (around 05/03/2021) for Routine Follow Up, w/ Dr. Burt Knack, or Richardson Dopp, PA-C, in person.   Medication Adjustments/Labs and Tests Ordered: Current medicines are reviewed at length with the patient today.  Concerns regarding medicines are outlined above.  Tests Ordered: No orders of the defined types were placed in this encounter.  Medication Changes: Meds ordered this encounter  Medications  . ticagrelor (BRILINTA) 60 MG TABS tablet    Sig: Take 1 tablet (60 mg total) by mouth 2 (two) times daily.    Dispense:  180 tablet    Refill:  2    Signed, Richardson Dopp, PA-C  11/01/2020 9:37 AM    [facility], [place], [place]   [address] Phone: [phone]; Fax: [phone]

## 2020-11-01 ENCOUNTER — Encounter: Payer: Self-pay | Admitting: Physician Assistant

## 2020-11-01 ENCOUNTER — Ambulatory Visit: Payer: 59 | Admitting: Physician Assistant

## 2020-11-01 ENCOUNTER — Other Ambulatory Visit: Payer: Self-pay

## 2020-11-01 VITALS — BP 140/72 | HR 75 | Ht 63.0 in | Wt 198.0 lb

## 2020-11-01 DIAGNOSIS — I25119 Atherosclerotic heart disease of native coronary artery with unspecified angina pectoris: Secondary | ICD-10-CM

## 2020-11-01 DIAGNOSIS — I5020 Unspecified systolic (congestive) heart failure: Secondary | ICD-10-CM

## 2020-11-01 DIAGNOSIS — R0602 Shortness of breath: Secondary | ICD-10-CM

## 2020-11-01 DIAGNOSIS — I2510 Atherosclerotic heart disease of native coronary artery without angina pectoris: Secondary | ICD-10-CM

## 2020-11-01 DIAGNOSIS — E782 Mixed hyperlipidemia: Secondary | ICD-10-CM | POA: Diagnosis not present

## 2020-11-01 MED ORDER — TICAGRELOR 60 MG PO TABS: 60.0000 mg | ORAL_TABLET | Freq: Two times a day (BID) | ORAL | 2 refills | Status: DC

## 2020-11-01 NOTE — Patient Instructions (Signed)
Medication Instructions:  Your physician has recommended you make the following change in your medication:   1.  Discontinue Imdur  2. Decrease Brilinta one tablet by mouth ( 60 mg ) twice daily. Sent in # 90 to requested pharmacy.  Finish up your (90mg ) tablets first.   *If you need a refill on your cardiac medications before your next appointment, please call your pharmacy*   Lab Work: -None  If you have labs (blood work) drawn today and your tests are completely normal, you will receive your results only by: Marland Kitchen MyChart Message (if you have MyChart) OR . A paper copy in the mail If you have any lab test that is abnormal or we need to change your treatment, we will call you to review the results.   Testing/Procedures: -None   Follow-Up: At [facility], you and your health needs are our priority.  As part of our continuing mission to provide you with exceptional heart care, we have created designated Provider Care Teams.  These Care Teams include your primary Cardiologist (physician) and Advanced Practice Providers (APPs -  Physician Assistants and Nurse Practitioners) who all work together to provide you with the care you need, when you need it.  We recommend signing up for the patient portal called "MyChart".  Sign up information is provided on this After Visit Summary.  MyChart is used to connect with patients for Virtual Visits (Telemedicine).  Patients are able to view lab/test results, encounter notes, upcoming appointments, etc.  Non-urgent messages can be sent to your provider as well.   To learn more about what you can do with MyChart, go to [email or web address].    Your next appointment:   6 month(s)  The format for your next appointment:   In Person  Provider:   You may see Sherren Mocha, MD or one of the following Advanced Practice Providers on your designated Care Team:    Richardson Dopp, [place]    Other Instructions Follow up with PCP for possible COPD.    Your physician wants you to follow-up in: 6 months with Dr. Burt Knack or Richardson Dopp, [place].  You will receive a reminder letter in the mail two months in advance. If you don't receive a letter, please call our office to schedule the follow-up appointment.

## 2020-11-09 NOTE — Telephone Encounter (Signed)
Called and spoke to patient husband - advised we do not resubmit any claims here at the office, that is done with our billing department. He states that he has already contacted the billing department and they have referral id to attach to the claim in order to resubmit to insurance. -pr

## 2020-12-22 LAB — COLOGUARD
COLOGUARD: POSITIVE — AB
Cologuard: POSITIVE — AB

## 2020-12-28 ENCOUNTER — Other Ambulatory Visit: Payer: Self-pay | Admitting: Cardiovascular Disease

## 2021-01-05 ENCOUNTER — Other Ambulatory Visit: Payer: 59 | Admitting: *Deleted

## 2021-01-05 ENCOUNTER — Other Ambulatory Visit: Payer: Self-pay

## 2021-01-05 DIAGNOSIS — I25119 Atherosclerotic heart disease of native coronary artery with unspecified angina pectoris: Secondary | ICD-10-CM

## 2021-01-05 DIAGNOSIS — E782 Mixed hyperlipidemia: Secondary | ICD-10-CM

## 2021-01-05 LAB — HEPATIC FUNCTION PANEL
ALT: 28 IU/L (ref 0–32)
AST: 23 IU/L (ref 0–40)
Albumin: 4.3 g/dL (ref 3.8–4.9)
Alkaline Phosphatase: 99 IU/L (ref 44–121)
Bilirubin Total: 0.4 mg/dL (ref 0.0–1.2)
Bilirubin, Direct: 0.16 mg/dL (ref 0.00–0.40)
Total Protein: 6.6 g/dL (ref 6.0–8.5)

## 2021-01-05 LAB — LIPID PANEL
Chol/HDL Ratio: 2.5 ratio (ref 0.0–4.4)
Cholesterol, Total: 124 mg/dL (ref 100–199)
HDL: 50 mg/dL (ref >39)
LDL Chol Calc (NIH): 54 mg/dL (ref 0–99)
Triglycerides: 107 mg/dL (ref 0–149)
VLDL Cholesterol Cal: 20 mg/dL (ref 5–40)

## 2021-01-08 ENCOUNTER — Encounter: Payer: Self-pay | Admitting: Physician Assistant

## 2021-02-15 ENCOUNTER — Telehealth: Payer: Self-pay

## 2021-02-15 ENCOUNTER — Encounter: Payer: Self-pay | Admitting: Physician Assistant

## 2021-02-15 ENCOUNTER — Ambulatory Visit: Payer: 59 | Admitting: Physician Assistant

## 2021-02-15 VITALS — BP 104/70 | HR 61 | Ht 62.0 in | Wt 196.0 lb

## 2021-02-15 DIAGNOSIS — R195 Other fecal abnormalities: Secondary | ICD-10-CM | POA: Diagnosis not present

## 2021-02-15 MED ORDER — PLENVU 140 G PO SOLR
1.0000 | ORAL | 0 refills | Status: DC
Start: 2021-02-15 — End: 2021-03-13

## 2021-02-15 NOTE — Patient Instructions (Addendum)
If you are age 59 or younger, your body mass index should be between 19-25. Your Body mass index is 35.85 kg/m. If this is out of the aformentioned range listed, please consider follow up with your Primary Care Provider.  __________________________________________________________  The [facility] providers would like to encourage you to use [facility] to communicate with providers for non-urgent requests or questions.  Due to long hold times on the telephone, sending your provider a message by [facility] may be a faster and more efficient way to get a response.  Please allow 48 business hours for a response.  Please remember that this is for non-urgent requests.   You have been scheduled for a colonoscopy. Please follow written instructions given to you at your visit today.  Please pick up your prep supplies at the pharmacy within the next 1-3 days. If you use inhalers (even only as needed), please bring them with you on the day of your procedure.  You will be contacted by our office prior to your procedure for directions on holding your Brilinta.  If you do not hear from our office 1 week prior to your scheduled procedure, please call [phone] to discuss.   Follow up as needed for now.  Thank you for entrusting me with your care and choosing [facility].  Amy Esterwood, PA-C

## 2021-02-15 NOTE — Telephone Encounter (Signed)
[facility] Pre-operative Risk Assessment     Request for surgical clearance:     Endoscopy Procedure  What type of surgery is being performed?     Colonoscopy  When is this surgery scheduled?     03/13/2021  What type of clearance is required ?   Pharmacy  Are there any medications that need to be held prior to surgery and how long? Brilinta starting 5 days prior  Practice name and name of physician performing surgery?      [facility]  What is your office phone and fax number?      Phone- [phone]  [phone]  Anesthesia type (None, local, MAC, general) ?       MAC

## 2021-02-15 NOTE — Progress Notes (Signed)
Subjective:    Patient ID: Rebecca Sanford, female    DOB: May 09, 1961, 60 y.o.   MRN: [id number]  HPI Rebecca Sanford is a pleasant 60 year old white female, new to GI today referred by Everardo Beals, NP with a positive Cologuard stool DNA test.  She has not had any prior GI evaluation or colon screening. She denies any problems with abdominal discomfort, changes in bowel habits, melena or hematochezia.  She says occasionally she will have some very mild constipation which does not require laxatives. Family history is pertinent for sister with colon polyps no family history of colon cancer that she is aware of. Patient has history of coronary artery disease is status post MI in April 2021 at which time she had a drug-eluting stent placed to the LAD and was also noted to have chronic occlusion of the RCA and moderate disease of the circumflex. Echo July 2021 EF 60 to 65%. She is followed by Dr. Cooper/cardiology and is currently on aspirin 81 mg and Brilinta. She says she has been doing well over the past year and has not had any further cardiac issues.  Review of Systems. Pertinent positive and negative review of systems were noted in the above HPI section.  All other review of systems was otherwise negative.   Outpatient Encounter Medications as of 02/15/2021  Medication Sig   acetaminophen (TYLENOL) 500 MG tablet Take 500 mg by mouth every 8 (eight) hours as needed.   albuterol (VENTOLIN HFA) 108 (90 Base) MCG/ACT inhaler Inhale 1-2 puffs into the lungs as needed for wheezing or shortness of breath.   ASPIRIN LOW DOSE 81 MG EC tablet TAKE 1 TABLET(81 MG) BY MOUTH DAILY   Calcium Carbonate-Vitamin D 300-100 MG-UNIT CAPS Take by mouth.   furosemide (LASIX) 20 MG tablet TAKE 1 TABLET(20 MG) BY MOUTH DAILY   losartan (COZAAR) 25 MG tablet TAKE 1 TABLET(25 MG) BY MOUTH DAILY   metoprolol succinate (TOPROL-XL) 25 MG 24 hr tablet TAKE 1/2 TABLET(12.5 MG) BY MOUTH DAILY   nitroGLYCERIN  (NITROSTAT) 0.4 MG SL tablet PLACE 1 TABLET UNDER TONGUE EVERY 5 MINUTES FOR 3 DOSES AS NEEDED FOR CHEST PAIN   PEG-KCl-NaCl-NaSulf-Na Asc-C (PLENVU) 140 g SOLR Take 1 kit by mouth as directed. Manufacturer's coupon Universal coupon code:BIN: P2366821; GROUP: [id number]; PCN: [id number]; ID: [id number]; PAY NO MORE $50; NO prior authorization   rosuvastatin (CRESTOR) 40 MG tablet Take 40 mg by mouth daily.   ticagrelor (BRILINTA) 60 MG TABS tablet Take 1 tablet (60 mg total) by mouth 2 (two) times daily.   ezetimibe (ZETIA) 10 MG tablet Take 1 tablet (10 mg total) by mouth daily.   [DISCONTINUED] omeprazole (PRILOSEC) 20 MG capsule Take 20 mg by mouth as needed (heartburn).    No facility-administered encounter medications on file as of 02/15/2021.   Allergies  Allergen Reactions   Codeine Nausea And Vomiting   Hydrocodone Nausea And Vomiting   Nitrofurantoin Macrocrystal Hives   Sulfa Antibiotics Hives   Patient Active Problem List   Diagnosis Date Noted   Ischemic cardiomyopathy 11/09/2019   Hyperlipidemia 11/09/2019   Acute ST elevation myocardial infarction (STEMI) of anterior wall ([place]) 11/07/2019   STEMI (ST elevation myocardial infarction) ([place]) 11/07/2019   Social History   Socioeconomic History   Marital status: Married    Spouse name: Not on file   Number of children: 2   Years of education: Not on file   Highest education level: Not on file  Occupational History   Occupation:  pharmacy tech    Employer: [employer]  Tobacco Use   Smoking status: Former    Packs/day: 0.50    Years: 30.00    Pack years: 15.00    Types: Cigarettes    Quit date: 06/06/2010    Years since quitting: 10.7   Smokeless tobacco: Never  Vaping Use   Vaping Use: Never used  Substance and Sexual Activity   Alcohol use: Never   Drug use: Never   Sexual activity: Never  Other Topics Concern   Not on file  Social History Narrative   Not on file   Social Determinants of Health   Financial  Resource Strain: Not on file  Food Insecurity: Not on file  Transportation Needs: Not on file  Physical Activity: Not on file  Stress: Not on file  Social Connections: Not on file  Intimate Partner Violence: Not on file    Ms. Koenen's family history includes CAD in her sister; Cancer - Other in her mother; Heart attack in her father.      Objective:    Vitals:   02/15/21 0936  BP: 104/70  Pulse: 61  SpO2: 98%    Physical Exam Well-developed well-nourished older white female in no acute distress.  Height, Weight, 196 BMI 35.8  HEENT; nontraumatic normocephalic, EOMI, PE R LA, sclera anicteric. Oropharynx; not examined today Neck; supple, no JVD Cardiovascular; regular rate and rhythm with S1-S2, no murmur rub or gallop Pulmonary; Clear bilaterally, somewhat decreased breath sounds bilaterally Abdomen; soft, obese, nontender, nondistended, no palpable mass or hepatosplenomegaly, bowel sounds are active Rectal; not done today Skin; benign exam, no jaundice rash or appreciable lesions Extremities; no clubbing cyanosis or edema skin warm and dry Neuro/Psych; alert and oriented x4, grossly nonfocal mood and affect appropriate        Assessment & Plan:   #80 60 year old female with positive Cologuard stool DNA test, asymptomatic  Rule out occult colon lesion, adenomatous polyps  #2 coronary artery disease status post MI April 2021-drug-eluting stent placed to the LAD and also noted to have chronically occluded RCA and moderate circumflex disease. EF normal 60 to 65% July 2021  #3 chronic antiplatelet therapy-on Brilinta and aspirin #4 hypertension #5.  Hyperlipidemia #6 status post appendectomy  Plan; Patient will be scheduled for colonoscopy with Dr. Tarri Glenn.  Procedure was discussed in detail with the patient including indications risk and benefits and she is agreeable to proceed. Patient will need to hold Brilinta for 5 days prior to the procedure, will continue on  baby aspirin.  We will communicate with her cardiologist/Dr. Burt Knack to assure that holding Brilinta is reasonable for this patient who is now more than 1 year out from DES placement.   Itati Brocksmith S Aris Moman PA-C 02/15/2021   Cc: Everardo Beals, NP

## 2021-02-16 NOTE — Progress Notes (Signed)
Reviewed and agree with management plans. ? ?Clovis Mankins L. Tyrece Vanterpool, MD, MPH  ?

## 2021-02-19 NOTE — Telephone Encounter (Signed)
   Primary Cardiologist: Sherren Mocha, MD  Chart reviewed as part of pre-operative protocol coverage. Given past medical history and time since last visit, based on ACC/AHA guidelines, Rebecca Sanford would be at acceptable risk for the planned procedure without further cardiovascular testing.   Her Brilinta may be held for 5 days prior to her procedure.  Please resume as soon as hemostasis is achieved.  I will route this recommendation to the requesting party via Epic fax function and remove from pre-op pool.  Please call with questions.  Jossie Ng. Angelise Petrich NP-C    02/19/2021, 3:08 PM [address] Office [phone] Fax [phone]

## 2021-02-19 NOTE — Telephone Encounter (Signed)
Rebecca Sanford 60 year old female is requesting cardiac preoperative evaluation for colonoscopy.  She was last seen in the clinic on 11/01/2020.  During that time she was noted to have MSK chest discomfort.  Her Myoview at that time showed low risk and no ischemia.  She has known chronically occluded RCA.  Her Brilinta was reduced to 60 mg twice daily and continued along with her aspirin therapy.  Her PMH also includes heart failure with improved EF, mixed hyperlipidemia, shortness of breath, and coronary artery disease status post STEMI 4/21 with PCI and DES to her LAD.  May her Brilinta be held prior to her procedure?  Thank you for your help.  Please direct your response to CV DIV preop pool.  Jossie Ng. Shawna Kiener NP-C    02/19/2021, 10:48 AM [place] [address] [phone] Fax [phone]

## 2021-02-19 NOTE — Telephone Encounter (Signed)
Okay to hold Brilinta as requested for the procedure.  She is greater than 12 months out from her MI.

## 2021-02-20 NOTE — Telephone Encounter (Signed)
Left  message for patient to return phone call about holding her Brilinta.

## 2021-02-21 NOTE — Telephone Encounter (Signed)
Patient has been notified that she can stop her Brilinta 5 days prior to her Colonoscopy.

## 2021-03-13 ENCOUNTER — Ambulatory Visit (AMBULATORY_SURGERY_CENTER): Payer: 59 | Admitting: Gastroenterology

## 2021-03-13 ENCOUNTER — Encounter: Payer: Self-pay | Admitting: Gastroenterology

## 2021-03-13 VITALS — BP 103/61 | HR 60 | Temp 97.5°F | Resp 14 | Ht 62.0 in | Wt 196.0 lb

## 2021-03-13 DIAGNOSIS — K635 Polyp of colon: Secondary | ICD-10-CM | POA: Diagnosis not present

## 2021-03-13 DIAGNOSIS — D123 Benign neoplasm of transverse colon: Secondary | ICD-10-CM

## 2021-03-13 DIAGNOSIS — D122 Benign neoplasm of ascending colon: Secondary | ICD-10-CM

## 2021-03-13 DIAGNOSIS — K648 Other hemorrhoids: Secondary | ICD-10-CM

## 2021-03-13 DIAGNOSIS — R195 Other fecal abnormalities: Secondary | ICD-10-CM | POA: Diagnosis not present

## 2021-03-13 DIAGNOSIS — D124 Benign neoplasm of descending colon: Secondary | ICD-10-CM

## 2021-03-13 MED ORDER — SODIUM CHLORIDE 0.9 % IV SOLN
500.0000 mL | Freq: Once | INTRAVENOUS | Status: AC
Start: 2021-03-13 — End: ?

## 2021-03-13 NOTE — Op Note (Addendum)
[place] Patient Name: Rebecca Sanford Procedure Date: 03/13/2021 12:46 PM MRN: [id number] Endoscopist: Thornton Park MD, MD Age: 60 Referring MD:  Date of Birth: [birth year] Gender: Female Account #: [id number] Procedure:                Colonoscopy Indications:              Positive Cologuard test                           Sister with colon polyps                           No prior colonoscopy Medicines:                Monitored Anesthesia Care Procedure:                Pre-Anesthesia Assessment:                           - Prior to the procedure, a History and Physical                            was performed, and patient medications and                            allergies were reviewed. The patient's tolerance of                            previous anesthesia was also reviewed. The risks                            and benefits of the procedure and the sedation                            options and risks were discussed with the patient.                            All questions were answered, and informed consent                            was obtained. Prior Anticoagulants: The patient has                            taken Brilenta, last 5 days ago. ASA Grade                            Assessment: III - A patient with severe systemic                            disease. After reviewing the risks and benefits,                            the patient was deemed in satisfactory condition to  undergo the procedure.                           After obtaining informed consent, the colonoscope                            was passed under direct vision. Throughout the                            procedure, the patient's blood pressure, pulse, and                            oxygen saturations were monitored continuously. The                            Olympus CF-HQ190L ([id number]) Colonoscope was                            introduced through the anus and  advanced to the 3                            cm into the ileum. A second forward view of the                            right colon was performed. The colonoscopy was                            performed without difficulty. The patient tolerated                            the procedure well. The quality of the bowel                            preparation was good. The terminal ileum, ileocecal                            valve, appendiceal orifice, and rectum were                            photographed. Scope In: 1:41:39 PM Scope Out: 2:00:38 PM Scope Withdrawal Time: 0 hours 15 minutes 23 seconds  Total Procedure Duration: 0 hours 18 minutes 59 seconds  Findings:                 The perianal and digital rectal examinations were                            normal.                           Two sessile polyps were found in the sigmoid colon                            and descending colon. The polyps were 2 to 8 mm in  size. These polyps were removed with a cold snare.                            Resection and retrieval were complete. Estimated                            blood loss was minimal.                           Four sessile polyps were found in the transverse                            colon and ascending colon. The polyps were 2 to 6                            mm in size. These polyps were removed with a cold                            snare. Resection and retrieval were complete.                            Estimated blood loss was minimal.                           Non-bleeding hemorrhoids were found.                           The exam was otherwise without abnormality on                            direct and retroflexion views. Complications:            No immediate complications. Estimated blood loss:                            Minimal. Estimated Blood Loss:     Estimated blood loss was minimal. Impression:               - Two 2 to 8 mm polyps in the  sigmoid colon and in                            the descending colon, removed with a cold snare.                            Resected and retrieved.                           - Four 2 to 6 mm polyps in the transverse colon and                            in the ascending colon, removed with a cold snare.                            Resected and retrieved.                           -  Non-bleeding hemorrhoids.                           - The examination was otherwise normal on direct                            and retroflexion views. Recommendation:           - Patient has a contact number available for                            emergencies. The signs and symptoms of potential                            delayed complications were discussed with the                            patient. Return to normal activities tomorrow.                            Written discharge instructions were provided to the                            patient.                           - Resume previous diet.                           - Continue present medications.                           - Resume Brilinta 03/15/21.                           - Await pathology results.                           - Repeat colonoscopy date to be determined after                            pending pathology results are reviewed for                            surveillance.                           - Emerging evidence supports eating a diet of                            fruits, vegetables, grains, calcium, and yogurt                            while reducing red meat and alcohol may reduce the                            risk of colon cancer.                           -  Thank you for allowing me to be involved in your                            colon cancer prevention. Thornton Park MD, MD 03/13/2021 2:07:29 PM This report has been signed electronically.

## 2021-03-13 NOTE — Progress Notes (Signed)
VS-CW  Reviewed and updated pt medical history.

## 2021-03-13 NOTE — Progress Notes (Signed)
Referring Provider: Everardo Beals, NP Primary Care Physician:  Everardo Beals, NP  Chief complaint:  Cologuard  IMPRESSION:  Cologuard positive Sister with colon polyps She has been off Brilinta in preparation for the procedure  PLAN: Colonoscopy today  Please see the "Patient Instructions" section for addition details about the plan.  HPI: Rebecca Sanford is a 60 y.o. female presents for colonoscopy for a positive Cologuard. See the outpatient consultation note from [place] 02/15/21 for full details.   She has a history coronary artery disease status post MI April 2021-drug-eluting stent placed to the LAD and also noted to have chronically occluded RCA and moderate circumflex disease. EF normal 60 to 65% July 2021  She has no GI symptoms. No prior endoscopy.   Sister with colon polyps. No other known family history of colon cancer or polyps. No family history of uterine/endometrial cancer, pancreatic cancer or gastric/stomach cancer.   Past Medical History:  Diagnosis Date   CAD (coronary artery disease)    STEMI 4/21 >> PCI/DES to LAD, occluded nondominent RCA, moderate LCx disease // Myoview 3/22: EF 79, no ischemia or infarction; low risk    Hyperlipidemia    Hypertension    Ischemic cardiomyopathy w Improved EF    EF 35% at time of MI in 10/2019 // Echo 01/2020: EF 60-65, GR 1 DD, normal RV SF   Myocardial infarction ([place]) 10/2019   Vitamin D deficiency     Past Surgical History:  Procedure Laterality Date   CHOLECYSTECTOMY     CORONARY/GRAFT ACUTE MI REVASCULARIZATION N/A 11/07/2019   Procedure: Coronary/Graft Acute MI Revascularization;  Surgeon: Sherren Mocha, MD;  Location: [facility];  Service: Cardiovascular;  Laterality: N/A;   LEFT HEART CATH AND CORONARY ANGIOGRAPHY N/A 11/07/2019   Procedure: LEFT HEART CATH AND CORONARY ANGIOGRAPHY;  Surgeon: Sherren Mocha, MD;  Location: [facility];  Service: Cardiovascular;   Laterality: N/A;    Current Outpatient Medications  Medication Sig Dispense Refill   acetaminophen (TYLENOL) 500 MG tablet Take 500 mg by mouth every 8 (eight) hours as needed.     ASPIRIN LOW DOSE 81 MG EC tablet TAKE 1 TABLET(81 MG) BY MOUTH DAILY 90 tablet 3   Calcium Carbonate-Vitamin D 300-100 MG-UNIT CAPS Take by mouth.     furosemide (LASIX) 20 MG tablet TAKE 1 TABLET(20 MG) BY MOUTH DAILY 90 tablet 2   losartan (COZAAR) 25 MG tablet TAKE 1 TABLET(25 MG) BY MOUTH DAILY 90 tablet 3   metoprolol succinate (TOPROL-XL) 25 MG 24 hr tablet TAKE 1/2 TABLET(12.5 MG) BY MOUTH DAILY 45 tablet 2   rosuvastatin (CRESTOR) 40 MG tablet Take 40 mg by mouth daily.     albuterol (VENTOLIN HFA) 108 (90 Base) MCG/ACT inhaler Inhale 1-2 puffs into the lungs as needed for wheezing or shortness of breath.     ezetimibe (ZETIA) 10 MG tablet Take 1 tablet (10 mg total) by mouth daily. 90 tablet 3   nitroGLYCERIN (NITROSTAT) 0.4 MG SL tablet PLACE 1 TABLET UNDER TONGUE EVERY 5 MINUTES FOR 3 DOSES AS NEEDED FOR CHEST PAIN 25 tablet 6   ticagrelor (BRILINTA) 60 MG TABS tablet Take 1 tablet (60 mg total) by mouth 2 (two) times daily. 180 tablet 2   Current Facility-Administered Medications  Medication Dose Route Frequency Provider Last Rate Last Admin   0.9 %  sodium chloride infusion  500 mL Intravenous Once Thornton Park, MD        Allergies as of 03/13/2021 -  Review Complete 03/13/2021  Allergen Reaction Noted   Codeine Nausea And Vomiting 11/07/2019   Hydrocodone Nausea And Vomiting 11/07/2019   Nitrofurantoin macrocrystal Hives 11/07/2019   Sulfa antibiotics Hives 11/07/2019    Family History  Problem Relation Age of Onset   Cancer - Other Mother    Heart attack Father    CAD Sister    Colon cancer Neg Hx    Rectal cancer Neg Hx    Stomach cancer Neg Hx       Physical Exam: General:   Alert,  well-nourished, pleasant and cooperative in NAD Head:  Normocephalic and atraumatic. Eyes:   Sclera clear, no icterus.   Conjunctiva pink. Ears:  Normal auditory acuity. Nose:  No deformity, discharge,  or lesions. Mouth:  No deformity or lesions.   Neck:  Supple; no masses or thyromegaly. Lungs:  Clear throughout to auscultation.   No wheezes. Heart:  Regular rate and rhythm; no murmurs. Abdomen:  Soft,nontender, nondistended, normal bowel sounds, no rebound or guarding. No hepatosplenomegaly.   Rectal:  Deferred  Msk:  Symmetrical. No boney deformities LAD: No inguinal or umbilical LAD Extremities:  No clubbing or edema. Neurologic:  Alert and  oriented x4;  grossly nonfocal Skin:  Intact without significant lesions or rashes. Psych:  Alert and cooperative. Normal mood and affect.      Rebecca Sanford L. Tarri Glenn, MD, MPH 03/13/2021, 1:33 PM

## 2021-03-13 NOTE — Progress Notes (Signed)
Called to room to assist during endoscopic procedure.  Patient ID and intended procedure confirmed with present staff. Received instructions for my participation in the procedure from the performing physician.  

## 2021-03-13 NOTE — Patient Instructions (Signed)
Resume previous diet and present medications. Awaiting pathology results. Repeat colonoscopy date to be determined based on pathology results for surveillance.  YOU HAD AN ENDOSCOPIC PROCEDURE TODAY AT [facility]:   Refer to the procedure report that was given to you for any specific questions about what was found during the examination.  If the procedure report does not answer your questions, please call your gastroenterologist to clarify.  If you requested that your care partner not be given the details of your procedure findings, then the procedure report has been included in a sealed envelope for you to review at your convenience later.  YOU SHOULD EXPECT: Some feelings of bloating in the abdomen. Passage of more gas than usual.  Walking can help get rid of the air that was put into your GI tract during the procedure and reduce the bloating. If you had a lower endoscopy (such as a colonoscopy or flexible sigmoidoscopy) you may notice spotting of blood in your stool or on the toilet paper. If you underwent a bowel prep for your procedure, you may not have a normal bowel movement for a few days.  Please Note:  You might notice some irritation and congestion in your nose or some drainage.  This is from the oxygen used during your procedure.  There is no need for concern and it should clear up in a day or so.  SYMPTOMS TO REPORT IMMEDIATELY:  Following lower endoscopy (colonoscopy or flexible sigmoidoscopy):  Excessive amounts of blood in the stool  Significant tenderness or worsening of abdominal pains  Swelling of the abdomen that is new, acute  Fever of 100F or higher    For urgent or emergent issues, a gastroenterologist can be reached at any hour by calling [phone]. Do not use MyChart messaging for urgent concerns.    DIET:  We do recommend a small meal at first, but then you may proceed to your regular diet.  Drink plenty of fluids but you should avoid alcoholic  beverages for 24 hours.  ACTIVITY:  You should plan to take it easy for the rest of today and you should NOT DRIVE or use heavy machinery until tomorrow (because of the sedation medicines used during the test).    FOLLOW UP: Our staff will call the number listed on your records 48-72 hours following your procedure to check on you and address any questions or concerns that you may have regarding the information given to you following your procedure. If we do not reach you, we will leave a message.  We will attempt to reach you two times.  During this call, we will ask if you have developed any symptoms of COVID 19. If you develop any symptoms (ie: fever, flu-like symptoms, shortness of breath, cough etc.) before then, please call [phone].  If you test positive for Covid 19 in the 2 weeks post procedure, please call and report this information to [place].    If any biopsies were taken you will be contacted by phone or by letter within the next 1-3 weeks.  Please call us at [phone] if you have not heard about the biopsies in 3 weeks.    SIGNATURES/CONFIDENTIALITY: You and/or your care partner have signed paperwork which will be entered into your electronic medical record.  These signatures attest to the fact that that the information above on your After Visit Summary has been reviewed and is understood.  Full responsibility of the confidentiality of this discharge information lies with  you and/or your care-partner.

## 2021-03-15 ENCOUNTER — Telehealth: Payer: Self-pay

## 2021-03-15 NOTE — Telephone Encounter (Signed)
  Follow up Call-  Call back number 03/13/2021  Post procedure Call Back phone  # [phone]  Permission to leave phone message Yes  Some recent data might be hidden     Patient questions:  Do you have a fever, pain , or abdominal swelling? No. Pain Score  0 *  Have you tolerated food without any problems? Yes.    Have you been able to return to your normal activities? Yes.    Do you have any questions about your discharge instructions: Diet   No. Medications  No. Follow up visit  No.  Do you have questions or concerns about your Care? No.  Actions: * If pain score is 4 or above: No action needed, pain <4.  Have you developed a fever since your procedure? no  2.   Have you had an respiratory symptoms (SOB or cough) since your procedure? no  3.   Have you tested positive for COVID 19 since your procedure no  4.   Have you had any family members/close contacts diagnosed with the COVID 19 since your procedure?  no   If yes to any of these questions please route to Joylene John, RN and Joella Prince, RN

## 2021-03-15 NOTE — Telephone Encounter (Signed)
First post procedure follow up call, no answer 

## 2021-03-19 ENCOUNTER — Encounter: Payer: Self-pay | Admitting: Gastroenterology

## 2021-06-19 MED ORDER — LOSARTAN POTASSIUM 25 MG PO TABS: ORAL_TABLET | ORAL | 1 refills | Status: DC

## 2021-06-19 MED ORDER — ASPIRIN 81 MG PO TBEC: DELAYED_RELEASE_TABLET | ORAL | 1 refills | Status: DC

## 2021-06-22 ENCOUNTER — Other Ambulatory Visit: Payer: Self-pay | Admitting: Cardiovascular Disease

## 2021-07-04 ENCOUNTER — Encounter: Payer: Self-pay | Admitting: Physician Assistant

## 2021-07-04 ENCOUNTER — Other Ambulatory Visit: Payer: Self-pay

## 2021-07-04 ENCOUNTER — Ambulatory Visit: Payer: 59 | Admitting: Physician Assistant

## 2021-07-04 VITALS — BP 118/72 | HR 68 | Ht 62.0 in | Wt 189.4 lb

## 2021-07-04 DIAGNOSIS — I2510 Atherosclerotic heart disease of native coronary artery without angina pectoris: Secondary | ICD-10-CM | POA: Insufficient documentation

## 2021-07-04 DIAGNOSIS — E782 Mixed hyperlipidemia: Secondary | ICD-10-CM | POA: Diagnosis not present

## 2021-07-04 DIAGNOSIS — I5020 Unspecified systolic (congestive) heart failure: Secondary | ICD-10-CM | POA: Diagnosis not present

## 2021-07-04 NOTE — Assessment & Plan Note (Signed)
Recent LDL optimal.  Continue rosuvastatin 40 mg daily, ezetimibe 10 mg daily.

## 2021-07-04 NOTE — Progress Notes (Signed)
Cardiology Office Note:    Date:  07/04/2021   ID:  Rebecca Sanford, DOB 06/23/1961, MRN [id number]  PCP:  Everardo Beals, NP   [facility] Providers Cardiologist:  Sherren Mocha, MD Cardiology APP:  Sharmon Revere     Referring MD: Everardo Beals, NP   Chief Complaint:  Follow-up for CAD    Patient Profile:   Rebecca Sanford is a 60 y.o. female with:  Coronary artery disease  S/p ant-sept STEMI 10/2019 PCI: DES to LAD DC on LifeVest RCA chronically occluded  Myoview 3/22: low risk  HFimpEF (heart failure with improved ejection fraction)  Ischemic CM Echocardiogram 10/2019: EF 30-35 Echocardiogram 02/08/20: EF 60-65 Hyperlipidemia   History of Present Illness: Rebecca Sanford was last seen in 4/22.  She returns for f/u.  She is here alone.  She has been doing well without chest pain, shortness of breath, syncope, orthopnea, leg edema.      ASSESSMENT & PLAN:   HFimpEF (heart failure with improved EF) Ischemic cardiomyopathy.  EF was 30-35 and improved to normal.  NYHA II.  No evidence of volume excess.  Continue furosemide 20 mg daily, losartan 25 mg daily, metoprolol succinate 12.5 mg daily.  CAD (coronary artery disease) History of anterior STEMI in 4/21 treated with a DES to the LAD.  She has moderate disease in the LCx and a chronically occluded RCA.    Myoview in 3/22 was low risk.  She is doing well without anginal symptoms.  We discussed switching low-dose ticagrelor to clopidogrel.  She prefers to remain on ticagrelor.  Continue aspirin 81 mg daily, ticagrelor 60 mg twice daily, ezetimibe 10 mg daily, losartan 25 mg daily, metoprolol succinate 12.5 mg daily, rosuvastatin 40 mg daily.  Consider continuing dual antiplatelet therapy for 3 years post MI.  Follow-up in 1 year.  Hyperlipidemia Recent LDL optimal.  Continue rosuvastatin 40 mg daily, ezetimibe 10 mg daily.          Dispo:  Return in about 1 year (around 07/04/2022) for Routine follow up in  1 year with Rebecca Dopp, PA-C.Marland Kitchen    Prior CV studies: Myoview 10/25/20 EF 79, no ischemia, low risk   Echocardiogram 02/08/20 EF 60-65, no RWMA, Gr 1 DD, normal RVSF   Echocardiogram 11/07/2019 EF 30-35, ant-sept AK, Gr 1 DD, normal RVSF, trivial MR   Cardiac catheterization 11/07/2019 LAD mid 95, 7 LCx ost 31, mid 38 RCA mid 100 (CTO) w/ L-R collats EF 25-35 PCI: 2.75 x 34 mm Resolute Onyx DES to LAD     Past Medical History:  Diagnosis Date   CAD (coronary artery disease)    STEMI 4/21 >> PCI/DES to LAD, occluded nondominent RCA, moderate LCx disease // Myoview 3/22: EF 79, no ischemia or infarction; low risk    Hyperlipidemia    Hypertension    Ischemic cardiomyopathy w Improved EF    EF 35% at time of MI in 10/2019 // Echo 01/2020: EF 60-65, GR 1 DD, normal RV SF   Myocardial infarction ([place]) 10/2019   Vitamin D deficiency    Current Medications: Current Meds  Medication Sig   acetaminophen (TYLENOL) 500 MG tablet Take 500 mg by mouth every 8 (eight) hours as needed.   albuterol (VENTOLIN HFA) 108 (90 Base) MCG/ACT inhaler Inhale 1-2 puffs into the lungs as needed for wheezing or shortness of breath.   aspirin (ASPIRIN LOW DOSE) 81 MG EC tablet TAKE 1 TABLET(81 MG) BY MOUTH DAILY   Calcium Carbonate-Vitamin  D 300-100 MG-UNIT CAPS Take by mouth.   ezetimibe (ZETIA) 10 MG tablet Take 1 tablet (10 mg total) by mouth daily.   furosemide (LASIX) 20 MG tablet TAKE 1 TABLET(20 MG) BY MOUTH DAILY   losartan (COZAAR) 25 MG tablet TAKE 1 TABLET(25 MG) BY MOUTH DAILY   metoprolol succinate (TOPROL-XL) 25 MG 24 hr tablet TAKE 1/2 TABLET(12.5 MG) BY MOUTH DAILY   nitroGLYCERIN (NITROSTAT) 0.4 MG SL tablet PLACE 1 TABLET UNDER TONGUE EVERY 5 MINUTES FOR 3 DOSES AS NEEDED FOR CHEST PAIN   rosuvastatin (CRESTOR) 40 MG tablet Take 40 mg by mouth daily.   ticagrelor (BRILINTA) 60 MG TABS tablet Take 1 tablet (60 mg total) by mouth 2 (two) times daily.   Current Facility-Administered  Medications for the 07/04/21 encounter (Office Visit) with Rebecca Sanford T, PA-C  Medication   0.9 %  sodium chloride infusion    Allergies:   Codeine, Hydrocodone, Nitrofurantoin macrocrystal, and Sulfa antibiotics   Social History   Tobacco Use   Smoking status: Former    Packs/day: 0.50    Years: 30.00    Pack years: 15.00    Types: Cigarettes    Quit date: 06/06/2010    Years since quitting: 11.0   Smokeless tobacco: Never  Vaping Use   Vaping Use: Never used  Substance Use Topics   Alcohol use: Never   Drug use: Never    Family Hx: The patient's family history includes CAD in her sister; Cancer - Other in her mother; Heart attack in her father. There is no history of Colon cancer, Rectal cancer, or Stomach cancer.  Review of Systems  Cardiovascular:  Negative for claudication.    EKGs/Labs/Other Test Reviewed:    EKG:  EKG is not ordered today.  The ekg ordered today demonstrates n/a  Recent Labs: 09/29/2020: BUN 13; Creatinine, Ser 0.73; Potassium 3.9; Sodium 141 01/05/2021: ALT 28   Recent Lipid Panel Lab Results  Component Value Date/Time   CHOL 124 01/05/2021 07:59 AM   TRIG 107 01/05/2021 07:59 AM   HDL 50 01/05/2021 07:59 AM   LDLCALC 54 01/05/2021 07:59 AM   Labs from Primary Care personally reviewed/interpreted: 11/26/20: TSH 2.42, Hgb 15.3, Cr 0.76, K 4.1, ALT 31, TC 138, HDL 53, LDL 66, Trig 108  Risk Assessment/Calculations:          Physical Exam:    VS:  BP 118/72   Pulse 68   Ht 5\' 2"  (1.575 m)   Wt 189 lb 6.4 oz (85.9 kg)   SpO2 97%   BMI 34.64 kg/m     Wt Readings from Last 3 Encounters:  07/04/21 189 lb 6.4 oz (85.9 kg)  03/13/21 196 lb (88.9 kg)  02/15/21 196 lb (88.9 kg)    Constitutional:      Appearance: Healthy appearance. Not in distress.  Neck:     Vascular: No JVR. JVD normal.  Pulmonary:     Effort: Pulmonary effort is normal.     Breath sounds: No wheezing. No rales.  Cardiovascular:     Normal rate. Regular rhythm.  Normal S1. Normal S2.      Murmurs: There is no murmur.  Edema:    Peripheral edema absent.  Abdominal:     Palpations: Abdomen is soft.  Skin:    General: Skin is warm and dry.  Neurological:     Mental Status: Alert and oriented to person, place and time.     Cranial Nerves: Cranial nerves are intact.  Medication Adjustments/Labs and Tests Ordered: Current medicines are reviewed at length with the patient today.  Concerns regarding medicines are outlined above.  Tests Ordered: No orders of the defined types were placed in this encounter.  Medication Changes: No orders of the defined types were placed in this encounter.  Signed, Rebecca Dopp, PA-C  07/04/2021 2:13 PM    [facility] [place], [place], [place]  [address] Phone: [phone]; Fax: [phone]

## 2021-07-04 NOTE — Patient Instructions (Signed)
Medication Instructions:   Your physician recommends that you continue on your current medications as directed. Please refer to the Current Medication list given to you today.  *If you need a refill on your cardiac medications before your next appointment, please call your pharmacy*   Lab Work:  -NONE  If you have labs (blood work) drawn today and your tests are completely normal, you will receive your results only by: [place] (if you have MyChart) OR A paper copy in the mail If you have any lab test that is abnormal or we need to change your treatment, we will call you to review the results.   Testing/Procedures:  -NONE   Follow-Up: At [facility], you and your health needs are our priority.  As part of our continuing mission to provide you with exceptional heart care, we have created designated Provider Care Teams.  These Care Teams include your primary Cardiologist (physician) and Advanced Practice Providers (APPs -  Physician Assistants and Nurse Practitioners) who all work together to provide you with the care you need, when you need it.  We recommend signing up for the patient portal called "MyChart".  Sign up information is provided on this After Visit Summary.  MyChart is used to connect with patients for Virtual Visits (Telemedicine).  Patients are able to view lab/test results, encounter notes, upcoming appointments, etc.  Non-urgent messages can be sent to your provider as well.   To learn more about what you can do with MyChart, go to [email or web address].    Your next appointment:   1 year(s)  The format for your next appointment:   In Person  Provider:   Richardson Dopp, PA-C         Other Instructions  Your physician wants you to follow-up in: 1 year with Richardson Dopp, PA-C.  You will receive a reminder letter in the mail two months in advance. If you don't receive a letter, please call our office to schedule the follow-up appointment.

## 2021-07-04 NOTE — Assessment & Plan Note (Signed)
Ischemic cardiomyopathy.  EF was 30-35 and improved to normal.  NYHA II.  No evidence of volume excess.  Continue furosemide 20 mg daily, losartan 25 mg daily, metoprolol succinate 12.5 mg daily.

## 2021-07-04 NOTE — Assessment & Plan Note (Signed)
History of anterior STEMI in 4/21 treated with a DES to the LAD.  She has moderate disease in the LCx and a chronically occluded RCA.    Myoview in 3/22 was low risk.  She is doing well without anginal symptoms.  We discussed switching low-dose ticagrelor to clopidogrel.  She prefers to remain on ticagrelor.  Continue aspirin 81 mg daily, ticagrelor 60 mg twice daily, ezetimibe 10 mg daily, losartan 25 mg daily, metoprolol succinate 12.5 mg daily, rosuvastatin 40 mg daily.  Consider continuing dual antiplatelet therapy for 3 years post MI.  Follow-up in 1 year.

## 2021-07-17 ENCOUNTER — Other Ambulatory Visit: Payer: Self-pay | Admitting: Cardiovascular Disease

## 2021-07-31 ENCOUNTER — Other Ambulatory Visit: Payer: Self-pay | Admitting: Physician Assistant

## 2021-08-01 MED ORDER — TICAGRELOR 60 MG PO TABS: 60.0000 mg | ORAL_TABLET | Freq: Two times a day (BID) | ORAL | 3 refills | Status: DC

## 2021-11-10 ENCOUNTER — Other Ambulatory Visit: Payer: Self-pay | Admitting: Physician Assistant

## 2021-11-23 ENCOUNTER — Other Ambulatory Visit: Payer: Self-pay | Admitting: *Deleted

## 2021-11-23 DIAGNOSIS — Z1231 Encounter for screening mammogram for malignant neoplasm of breast: Secondary | ICD-10-CM

## 2021-11-29 ENCOUNTER — Ambulatory Visit
Admission: RE | Admit: 2021-11-29 | Discharge: 2021-11-29 | Disposition: A | Discharge status: Home / Self Care | Payer: 59 | Source: Ambulatory Visit | Attending: *Deleted | Admitting: *Deleted

## 2021-11-29 DIAGNOSIS — Z1231 Encounter for screening mammogram for malignant neoplasm of breast: Secondary | ICD-10-CM

## 2021-11-30 ENCOUNTER — Other Ambulatory Visit: Payer: Self-pay | Admitting: *Deleted

## 2021-11-30 DIAGNOSIS — R928 Other abnormal and inconclusive findings on diagnostic imaging of breast: Secondary | ICD-10-CM

## 2021-12-08 ENCOUNTER — Other Ambulatory Visit: Payer: Self-pay | Admitting: *Deleted

## 2021-12-08 ENCOUNTER — Ambulatory Visit
Admission: RE | Admit: 2021-12-08 | Discharge: 2021-12-08 | Disposition: A | Discharge status: Home / Self Care | Payer: 59 | Source: Ambulatory Visit | Attending: *Deleted | Admitting: *Deleted

## 2021-12-08 DIAGNOSIS — R921 Mammographic calcification found on diagnostic imaging of breast: Secondary | ICD-10-CM

## 2021-12-08 DIAGNOSIS — R928 Other abnormal and inconclusive findings on diagnostic imaging of breast: Secondary | ICD-10-CM

## 2021-12-11 ENCOUNTER — Other Ambulatory Visit: Payer: Self-pay | Admitting: *Deleted

## 2021-12-11 DIAGNOSIS — R921 Mammographic calcification found on diagnostic imaging of breast: Secondary | ICD-10-CM

## 2021-12-14 ENCOUNTER — Ambulatory Visit
Admission: RE | Admit: 2021-12-14 | Discharge: 2021-12-14 | Disposition: A | Discharge status: Home / Self Care | Payer: 59 | Source: Ambulatory Visit | Attending: *Deleted | Admitting: *Deleted

## 2021-12-14 DIAGNOSIS — R921 Mammographic calcification found on diagnostic imaging of breast: Secondary | ICD-10-CM

## 2021-12-15 ENCOUNTER — Other Ambulatory Visit: Payer: Self-pay | Admitting: Cardiovascular Disease

## 2021-12-17 ENCOUNTER — Other Ambulatory Visit: Payer: Self-pay

## 2021-12-17 MED ORDER — LOSARTAN POTASSIUM 25 MG PO TABS
ORAL_TABLET | ORAL | 2 refills | Status: DC
Start: 2021-12-17 — End: 2022-10-28

## 2021-12-26 ENCOUNTER — Other Ambulatory Visit: Payer: Self-pay | Admitting: Cardiovascular Disease

## 2022-01-07 ENCOUNTER — Other Ambulatory Visit: Payer: Self-pay | Admitting: Cardiovascular Disease

## 2022-02-26 ENCOUNTER — Telehealth: Payer: Self-pay

## 2022-02-26 NOTE — Telephone Encounter (Signed)
Received call from [facility] with PCP office requesting prior auth number from patient's previous colon in 2022. She also has other insurance related questions needed answered in order to help assist this patient with coverage for an upcoming procedure. She can be reached at [phone], ext [phone]. Requesting a call back to discuss further.

## 2022-05-14 ENCOUNTER — Other Ambulatory Visit: Payer: Self-pay | Admitting: Physician Assistant

## 2022-07-01 ENCOUNTER — Other Ambulatory Visit: Payer: Self-pay | Admitting: Cardiovascular Disease

## 2022-07-04 NOTE — Progress Notes (Signed)
Cardiology Office Note:    Date:  07/05/2022   ID:  Rebecca Sanford, DOB 05/03/1961, MRN [id number]  PCP:  Everardo Beals, NP  [place] Providers Cardiologist:  Sherren Mocha, MD Cardiology APP:  Sharmon Revere    Referring MD: Everardo Beals, NP   Chief Complaint:  F/u for CAD    Patient Profile: Coronary artery disease  Ant-sept STEMI 10/2019 s/p 2.75 x 34 mm DES to mid LAD LHC 11/08/2019: LAD mid 95, 80/LCx ostial 50, mid 50/RCA mid 100 (L-R collaterals)/EF 30-35 DC w LifeVest CTO of RCA Myoview 10/25/2020: EF 79, no ischemia or infarction, low risk  HFimpEF (heart failure with improved ejection fraction)  Ischemic CM Echocardiogram 10/2019: EF 30-35 Limited echo 02/08/2020: EF 60-65, no RWMA, G1 DD, normal RVSF, RAP 3 Hyperlipidemia      History of Present Illness:   Rebecca Sanford is a 61 y.o. female with the above problem list.  She was last seen 07/04/2021.  She returns for follow-up.  She is here alone.  Her son and his family live with her and her husband.  They have 2 young children and she tends to get frequent URI symptoms.  She has not had chest pain, significant shortness of breath, orthopnea, leg edema or syncope.  She does not have a history of carpal tunnel or biceps tendon rupture.  EKG: NSR, HR 73, normal axis, low voltage, anterior Q waves no ST-T wave changes, QTc 425    Reviewed and updated this encounter:  Tobacco  Allergies  Meds  Problems  Med Hx  Surg Hx  Fam Hx      Review of Systems  Cardiovascular:  Negative for claudication.  Gastrointestinal:  Negative for hematochezia and melena.  Genitourinary:  Negative for hematuria.     Labs/Other Test Reviewed:    Recent Labs: No results found for requested labs within last 365 days.   Recent Lipid Panel No results for input(s): "CHOL", "TRIG", "HDL", "VLDL", "LDLCALC", "LDLDIRECT" in the last 8760 hours.    Risk Assessment/Calculations/Metrics:               Physical Exam:    VS:  BP 118/82   Pulse 73   Ht '5\' 3"'$  (1.6 m)   Wt 203 lb 6.4 oz (92.3 kg)   SpO2 97%   BMI 36.03 kg/m     Wt Readings from Last 3 Encounters:  07/05/22 203 lb 6.4 oz (92.3 kg)  07/04/21 189 lb 6.4 oz (85.9 kg)  03/13/21 196 lb (88.9 kg)    Constitutional:      Appearance: Healthy appearance. Not in distress.  Neck:     Vascular: JVD normal.  Pulmonary:     Effort: Pulmonary effort is normal.     Breath sounds: No wheezing. No rales.  Cardiovascular:     Normal rate. Regular rhythm. Normal S1. Normal S2.      Murmurs: There is no murmur.  Edema:    Peripheral edema absent.  Abdominal:     Palpations: There is no hepatomegaly.         ASSESSMENT & PLAN:   CAD (coronary artery disease) History of anterior STEMI in 4/21 treated with a DES to the LAD.  She has moderate disease in the LCx and a chronically occluded RCA.    Myoview in 3/22 was low risk.  She is stable on her current regimen without symptoms of angina. Continue ASA 81 mg once daily, Zetia 10 mg once  daily, Toprol XL 12.5 mg once daily, Crestor 40 mg once daily, Brilinta 60 mg twice daily. In April 2024, she will be 3 years out from her MI. She can DC ASA in April 2024 and remain on single agent with Brilinta. F/u in 1 year.   Hyperlipidemia Continue Crestor 40 mg daily, Zetia 10 mg daily.  Her last LDL in June 2022 was optimal at 54.  She will have follow-up labs with her PCP in the near future.  Ischemic cardiomyopathy EF was 30-35 the time of her myocardial infarction.  This improved to normal by echocardiogram in July 2021.  Continue losartan 25 mg daily, Toprol-XL 12.5 mg daily, Lasix 20 mg daily.         Dispo:  Return in about 1 year (around 07/06/2023) for Routine Follow Up, w/ Dr. Burt Knack, or Richardson Dopp, PA-C.   Medication Adjustments/Labs and Tests Ordered: Current medicines are reviewed at length with the patient today.  Concerns regarding medicines are outlined above.  Tests  Ordered: Orders Placed This Encounter  Procedures   EKG 12-Lead   Medication Changes: No orders of the defined types were placed in this encounter.  Signed, Richardson Dopp, PA-C  07/05/2022 8:36 AM    [place], [place], [place]  [address] Phone: [phone]; Fax: [phone]

## 2022-07-05 ENCOUNTER — Ambulatory Visit: Payer: 59 | Attending: Physician Assistant | Admitting: Physician Assistant

## 2022-07-05 ENCOUNTER — Encounter: Payer: Self-pay | Admitting: Physician Assistant

## 2022-07-05 ENCOUNTER — Other Ambulatory Visit: Payer: Self-pay | Admitting: Cardiovascular Disease

## 2022-07-05 VITALS — BP 118/82 | HR 73 | Ht 63.0 in | Wt 203.4 lb

## 2022-07-05 DIAGNOSIS — E782 Mixed hyperlipidemia: Secondary | ICD-10-CM | POA: Diagnosis not present

## 2022-07-05 DIAGNOSIS — I2510 Atherosclerotic heart disease of native coronary artery without angina pectoris: Secondary | ICD-10-CM | POA: Diagnosis not present

## 2022-07-05 DIAGNOSIS — I255 Ischemic cardiomyopathy: Secondary | ICD-10-CM | POA: Diagnosis not present

## 2022-07-05 NOTE — Patient Instructions (Signed)
Medication Instructions:  Your physician has recommended you make the following change in your medication:   Stop Aspirin in April of 2024 *If you need a refill on your cardiac medications before your next appointment, please call your pharmacy*  Follow-Up: At [facility], you and your health needs are our priority.  As part of our continuing mission to provide you with exceptional heart care, we have created designated Provider Care Teams.  These Care Teams include your primary Cardiologist (physician) and Advanced Practice Providers (APPs -  Physician Assistants and Nurse Practitioners) who all work together to provide you with the care you need, when you need it.   Your next appointment:   1 year(s)  The format for your next appointment:   In Person  Provider:   Richardson Dopp, PA  Important Information About Sugar

## 2022-07-05 NOTE — Assessment & Plan Note (Signed)
Continue Crestor 40 mg daily, Zetia 10 mg daily.  Her last LDL in June 2022 was optimal at 54.  She will have follow-up labs with her PCP in the near future.

## 2022-07-05 NOTE — Assessment & Plan Note (Signed)
History of anterior STEMI in 4/21 treated with a DES to the LAD.  She has moderate disease in the LCx and a chronically occluded RCA.    Myoview in 3/22 was low risk.  She is stable on her current regimen without symptoms of angina. Continue ASA 81 mg once daily, Zetia 10 mg once daily, Toprol XL 12.5 mg once daily, Crestor 40 mg once daily, Brilinta 60 mg twice daily. In April 2024, she will be 3 years out from her MI. She can DC ASA in April 2024 and remain on single agent with Brilinta. F/u in 1 year.

## 2022-07-05 NOTE — Assessment & Plan Note (Signed)
EF was 30-35 the time of her myocardial infarction.  This improved to normal by echocardiogram in July 2021.  Continue losartan 25 mg daily, Toprol-XL 12.5 mg daily, Lasix 20 mg daily.

## 2022-07-26 ENCOUNTER — Other Ambulatory Visit: Payer: Self-pay | Admitting: Cardiovascular Disease

## 2022-07-30 ENCOUNTER — Other Ambulatory Visit: Payer: Self-pay | Admitting: Cardiovascular Disease

## 2022-08-16 ENCOUNTER — Other Ambulatory Visit: Payer: Self-pay | Admitting: Physician Assistant

## 2022-08-16 MED ORDER — EZETIMIBE 10 MG PO TABS: 10.0000 mg | ORAL_TABLET | Freq: Every day | ORAL | 3 refills | Status: DC

## 2022-08-16 NOTE — Addendum Note (Signed)
Addended by: Michelle Nasuti on: 08/16/2022 10:05 AM   Modules accepted: Orders

## 2022-10-07 ENCOUNTER — Other Ambulatory Visit: Payer: Self-pay | Admitting: Cardiovascular Disease

## 2022-10-27 ENCOUNTER — Other Ambulatory Visit: Payer: Self-pay | Admitting: Cardiovascular Disease

## 2022-10-30 ENCOUNTER — Other Ambulatory Visit: Payer: Self-pay | Admitting: Cardiovascular Disease

## 2023-05-06 ENCOUNTER — Other Ambulatory Visit: Payer: Self-pay | Admitting: Cardiovascular Disease

## 2023-05-07 IMAGING — MG MM BREAST BX W/ LOC DEV 1ST LESION IMAGE BX SPEC STEREO GUIDE*R*
8 series · 8 of 20 positions shown · non-contrast
Comparison: None Available.
COMPARISON: None Available.

Addendum:
CLINICAL DATA: 60-year-old female with indeterminate right breast
calcifications.

EXAM:
RIGHT BREAST STEREOTACTIC CORE NEEDLE BIOPSY

[R (1 of 4)]
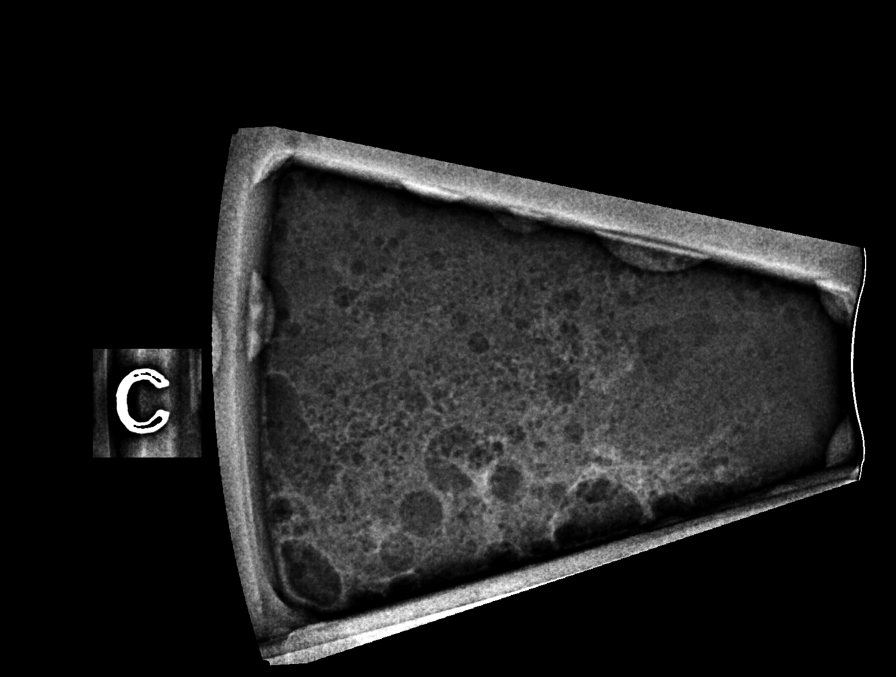

[R (2 of 4)]
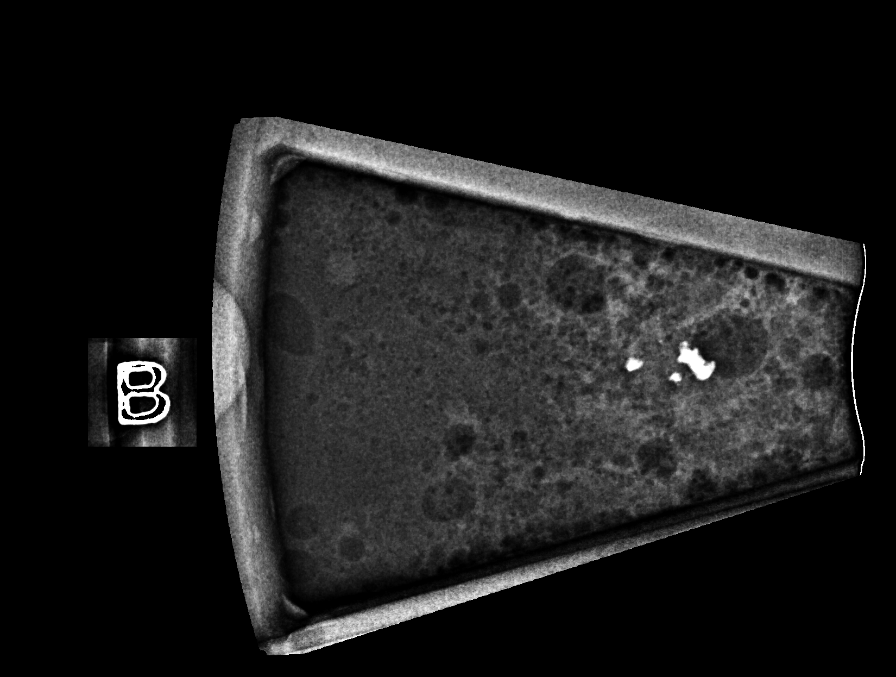

[R (3 of 4)]
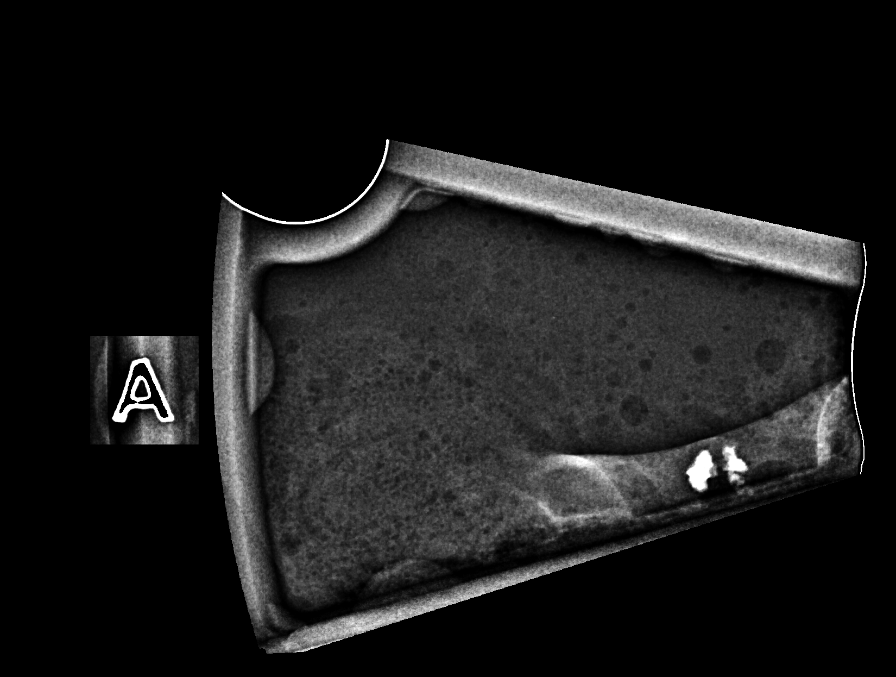

[R (4 of 4)]
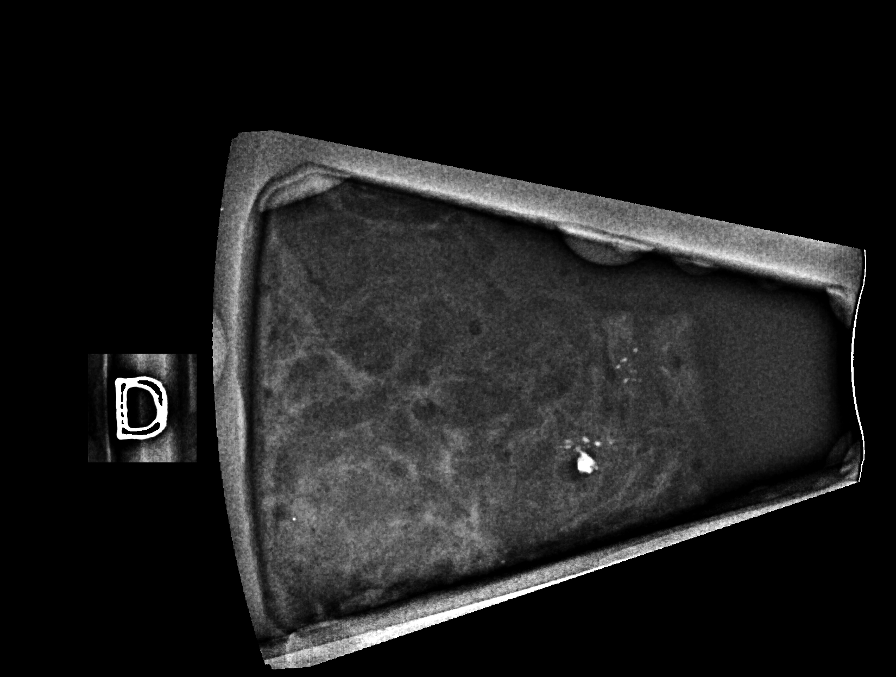

[R LM]
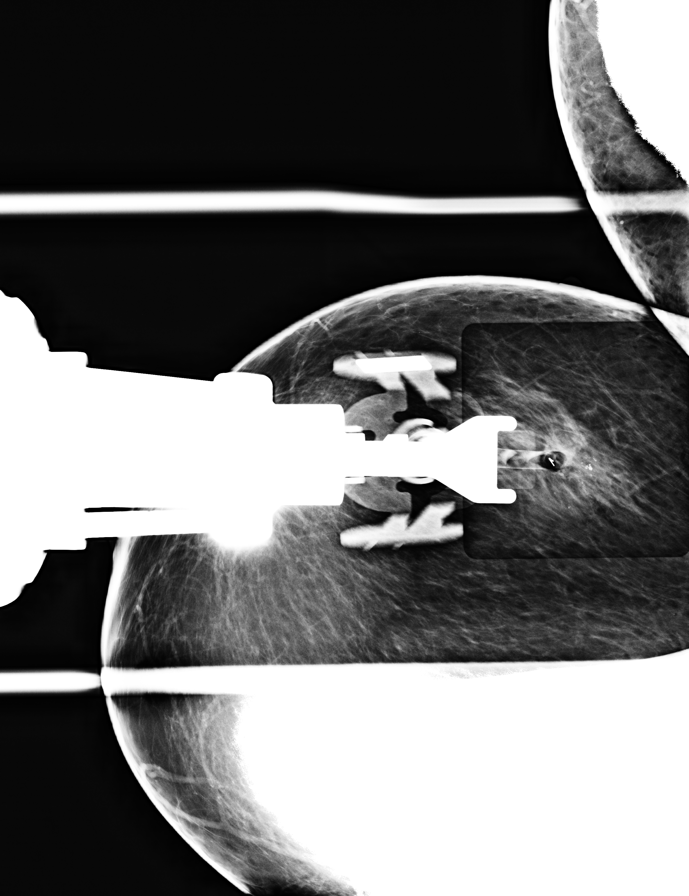

[R LM tomo (1 of 3) · tomo slice 35/68.0]
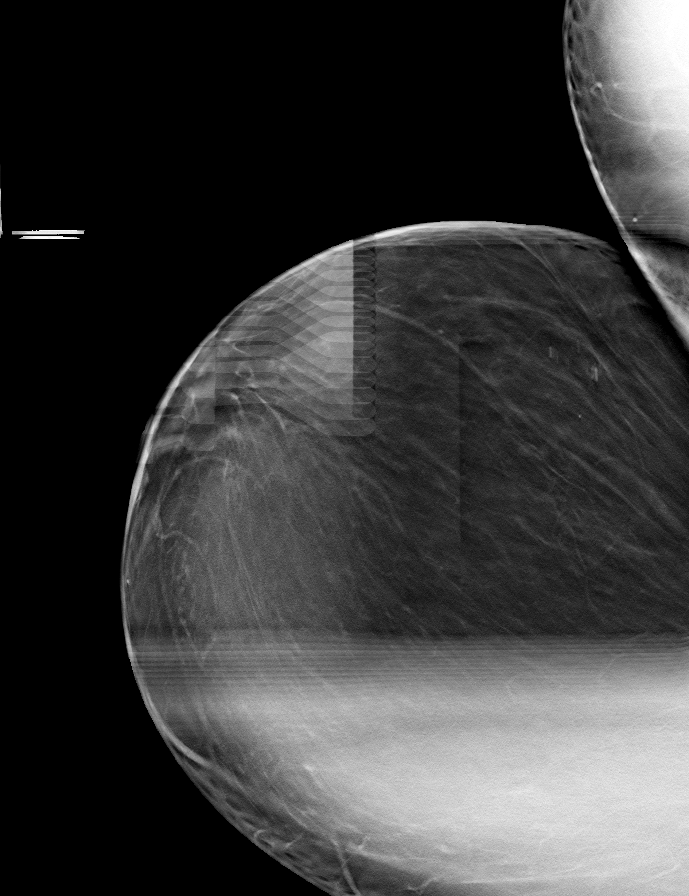

[R LM tomo (2 of 3) · tomo slice 31/61.0]
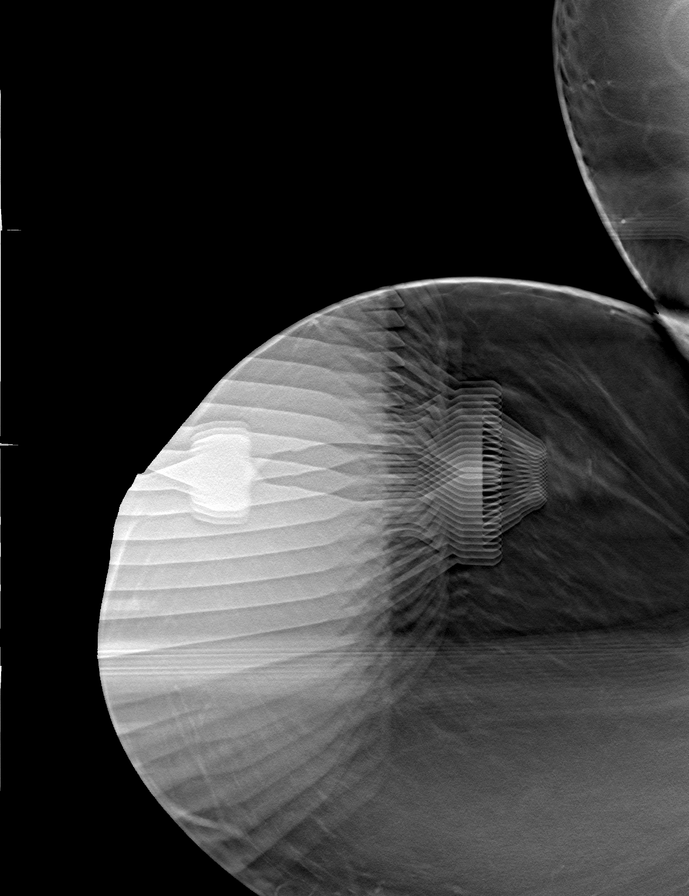

[R LM tomo (3 of 3) · tomo slice 31/61.0]
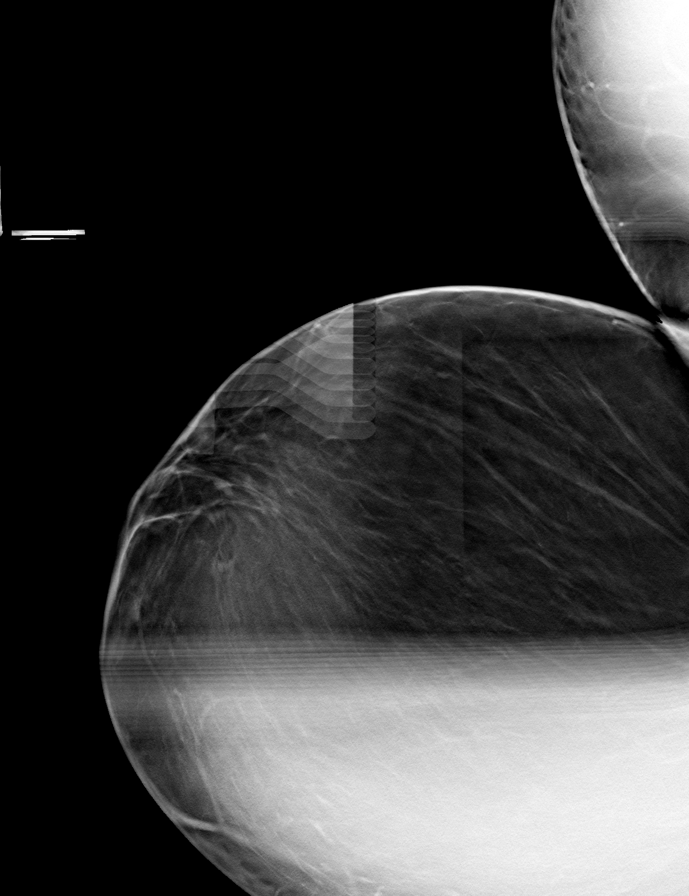

[8 of 20 positions shown; findings below may reference images not displayed]



Using sterile technique and 1% Lidocaine as local anesthetic, under
stereotactic guidance, a 9 gauge vacuum assisted device was used to
perform core needle biopsy of calcifications in the lower outer
quadrant of the right breast using a lateral approach. Specimen
radiograph was performed showing calcifications within several
specimens. Specimens with calcifications are identified for
pathology.

Lesion quadrant: Lower outer quadrant

At the conclusion of the procedure, a ribbon shaped tissue marker
clip was deployed into the biopsy cavity. Follow-up 2-view mammogram
was performed and dictated separately.
IMPRESSION: Stereotactic-guided biopsy of the right breast. No apparent
complications.

ADDENDUM:
Pathology revealed BENIGN BREAST TISSUE WITH FOCAL FIBROADENOMATOID
CHANGE AND ASSOCIATED CALCIFICATIONS of the RIGHT breast, lower
outer quadrant, (ribbon clip). This was found to be concordant by
Dr. Kvarnerska Hozjak.

Pathology results were discussed with the patient by telephone. The
patient reported doing well after the biopsy with tenderness at the
site. Post biopsy instructions and care were reviewed and questions
were answered. The patient was encouraged to call The [REDACTED] for any additional concerns. My direct phone
number was provided.

The patient was instructed to return for annual screening
mammography in November 2022.

Pathology results reported by Ivanka Igor Stapar, RN on 12/18/2021.



Using sterile technique and 1% Lidocaine as local anesthetic, under
stereotactic guidance, a 9 gauge vacuum assisted device was used to
perform core needle biopsy of calcifications in the lower outer
quadrant of the right breast using a lateral approach. Specimen
radiograph was performed showing calcifications within several
specimens. Specimens with calcifications are identified for
pathology.

Lesion quadrant: Lower outer quadrant

At the conclusion of the procedure, a ribbon shaped tissue marker
clip was deployed into the biopsy cavity. Follow-up 2-view mammogram
was performed and dictated separately.
IMPRESSION: Stereotactic-guided biopsy of the right breast. No apparent
complications.

## 2023-06-30 ENCOUNTER — Other Ambulatory Visit: Payer: Self-pay | Admitting: Cardiovascular Disease

## 2023-07-02 ENCOUNTER — Other Ambulatory Visit: Payer: Self-pay | Admitting: *Deleted

## 2023-07-02 MED ORDER — FUROSEMIDE 20 MG PO TABS: 20.0000 mg | ORAL_TABLET | Freq: Every day | ORAL | 3 refills | Status: DC

## 2023-07-10 NOTE — Progress Notes (Signed)
Cardiology Office Note:    Date:  07/11/2023  ID:  Rebecca Sanford, DOB 08-07-1960, MRN [id number] PCP: Elie Confer, NP  [facility] Providers Cardiologist:  Tonny Bollman, MD Cardiology APP:  Beatrice Lecher, PA-C       Patient Profile:      Coronary artery disease  Ant-sept STEMI 10/2019 s/p 2.75 x 34 mm DES to mid LAD [facility] 11/08/2019: LAD mid 95, 80/LCx ostial 50, mid 50/RCA mid 100 (L-R collaterals)/EF 30-35 DC w LifeVest CTO of RCA Myoview 10/25/2020: EF 79, no ischemia or infarction, low risk  HFimpEF (heart failure with improved ejection fraction)  Ischemic CM Echocardiogram 10/2019: EF 30-35 Limited echo 02/08/2020: EF 60-65, no RWMA, G1 DD, normal RVSF, RAP 3 Hyperlipidemia           History of Present Illness:  Discussed the use of AI scribe software for clinical note transcription with the patient, who gave verbal consent to proceed.  Rebecca Sanford is a 62 y.o. female who returns for follow up of CAD. She was last seen in 06/2022. She is here alone. The patient reports no chest pain, shortness of breath, or leg swelling. However, she notes minor ankle swelling after prolonged sitting, related to accompanying her husband who was dx with vasculitis to his medical treatments. The patient denies any trouble breathing when lying flat, no episodes of passing out, and no bleeding or bruising.      Review of Systems  Hematologic/Lymphatic: Does not bruise/bleed easily.  See HPI     Studies Reviewed:   EKG Interpretation Date/Time:  Friday July 11 2023 08:06:35 EST Ventricular Rate:  73 PR Interval:  150 QRS Duration:  70 QT Interval:  392 QTC Calculation: 431 R Axis:   50  Text Interpretation: Normal sinus rhythm Low voltage QRS No significant change since last tracing Confirmed by Tereso Newcomer [phone]) on 07/11/2023 8:11:40 AM           Risk Assessment/Calculations:             Physical Exam:   VS:  BP 110/70   Pulse 73   Ht 5\' 3"   (1.6 m)   Wt 206 lb 12.8 oz (93.8 kg)   SpO2 97%   BMI 36.63 kg/m    Wt Readings from Last 3 Encounters:  07/11/23 206 lb 12.8 oz (93.8 kg)  07/05/22 203 lb 6.4 oz (92.3 kg)  07/04/21 189 lb 6.4 oz (85.9 kg)    Constitutional:      Appearance: Healthy appearance. Not in distress.  Neck:     Vascular: JVD normal.  Pulmonary:     Breath sounds: Normal breath sounds. No wheezing. No rales.  Cardiovascular:     Normal rate. Regular rhythm.     Murmurs: There is no murmur.  Edema:    Peripheral edema absent.  Abdominal:     Palpations: Abdomen is soft.        Assessment and Plan:   Assessment & Plan Coronary artery disease involving native coronary artery of native heart without angina pectoris Status post anteroseptal STEMI in April 2021, treated with DES to mid LAD. Cardiac catheterization showed 50% ostial and mid LCx stenosis and 100% mid RCA chronic total occlusion with left to right collaterals. Initial EF was 30-35% but improved to 60-65% by July 2021. Currently asymptomatic without anginal symptoms.  -Continue Brilinta 60 mg twice daily, Rosuvastatin 40 mg once daily, Metoprolol succinate 12.5 mg once daily, PRN NTG -Follow up  1 year.  Ischemic cardiomyopathy EF improved from 30-35 to 60-65 in July 2021.  She is doing well without symptoms of heart failure.  Continue losartan 25 mg daily, Toprol-XL 12.5 mg daily, Lasix 20 mg daily. Mixed hyperlipidemia She ran out of Rosuvastatin 1 week ago. Refill Rosuvastatin. -Continue Rosuvastatin 40 mg once daily, Zetia 10 mg once daily  -CMET, Lipids today      Dispo:  Return in about 1 year (around 07/10/2024) for Routine Follow Up, w/ Dr. Excell Seltzer, or Tereso Newcomer, PA-C.  Signed, Tereso Newcomer, PA-C

## 2023-07-11 ENCOUNTER — Encounter: Payer: Self-pay | Admitting: Physician Assistant

## 2023-07-11 ENCOUNTER — Ambulatory Visit: Payer: 59 | Attending: Physician Assistant | Admitting: Physician Assistant

## 2023-07-11 VITALS — BP 110/70 | HR 73 | Ht 63.0 in | Wt 206.8 lb

## 2023-07-11 DIAGNOSIS — I255 Ischemic cardiomyopathy: Secondary | ICD-10-CM

## 2023-07-11 DIAGNOSIS — E782 Mixed hyperlipidemia: Secondary | ICD-10-CM | POA: Diagnosis not present

## 2023-07-11 DIAGNOSIS — I2510 Atherosclerotic heart disease of native coronary artery without angina pectoris: Secondary | ICD-10-CM

## 2023-07-11 MED ORDER — ROSUVASTATIN CALCIUM 40 MG PO TABS: 40.0000 mg | ORAL_TABLET | Freq: Every day | ORAL | 3 refills | Status: DC

## 2023-07-11 MED ORDER — TICAGRELOR 60 MG PO TABS: 60.0000 mg | ORAL_TABLET | Freq: Two times a day (BID) | ORAL | 3 refills | Status: DC

## 2023-07-11 NOTE — Patient Instructions (Signed)
Medication Instructions:  Your physician recommends that you continue on your current medications as directed. Please refer to the Current Medication list given to you today.  *If you need a refill on your cardiac medications before your next appointment, please call your pharmacy*   Lab Work: TODAY:  CMET & LIPID  If you have labs (blood work) drawn today and your tests are completely normal, you will receive your results only by: MyChart Message (if you have MyChart) OR A paper copy in the mail If you have any lab test that is abnormal or we need to change your treatment, we will call you to review the results.   Testing/Procedures: None ordered   Follow-Up: At [facility], you and your health needs are our priority.  As part of our continuing mission to provide you with exceptional heart care, we have created designated Provider Care Teams.  These Care Teams include your primary Cardiologist (physician) and Advanced Practice Providers (APPs -  Physician Assistants and Nurse Practitioners) who all work together to provide you with the care you need, when you need it.  We recommend signing up for the patient portal called "MyChart".  Sign up information is provided on this After Visit Summary.  MyChart is used to connect with patients for Virtual Visits (Telemedicine).  Patients are able to view lab/test results, encounter notes, upcoming appointments, etc.  Non-urgent messages can be sent to your provider as well.   To learn more about what you can do with MyChart, go to [email or web address].    Your next appointment:   1 year(s)  Provider:   Tonny Bollman, MD  or Tereso Newcomer, PA-C         Other Instructions

## 2023-07-11 NOTE — Assessment & Plan Note (Signed)
She ran out of Rosuvastatin 1 week ago. Refill Rosuvastatin. -Continue Rosuvastatin 40 mg once daily, Zetia 10 mg once daily  -CMET, Lipids today

## 2023-07-11 NOTE — Assessment & Plan Note (Signed)
EF improved from 30-35 to 60-65 in July 2021.  She is doing well without symptoms of heart failure.  Continue losartan 25 mg daily, Toprol-XL 12.5 mg daily, Lasix 20 mg daily.

## 2023-07-11 NOTE — Assessment & Plan Note (Signed)
Status post anteroseptal STEMI in April 2021, treated with DES to mid LAD. Cardiac catheterization showed 50% ostial and mid LCx stenosis and 100% mid RCA chronic total occlusion with left to right collaterals. Initial EF was 30-35% but improved to 60-65% by July 2021. Currently asymptomatic without anginal symptoms.  -Continue Brilinta 60 mg twice daily, Rosuvastatin 40 mg once daily, Metoprolol succinate 12.5 mg once daily, PRN NTG -Follow up 1 year.

## 2023-07-12 LAB — COMPREHENSIVE METABOLIC PANEL
ALT: 34 [IU]/L — ABNORMAL HIGH (ref 0–32)
AST: 22 [IU]/L (ref 0–40)
Albumin: 4.4 g/dL (ref 3.9–4.9)
Alkaline Phosphatase: 116 [IU]/L (ref 44–121)
BUN/Creatinine Ratio: 14 (ref 12–28)
BUN: 11 mg/dL (ref 8–27)
Bilirubin Total: 0.6 mg/dL (ref 0.0–1.2)
CO2: 25 mmol/L (ref 20–29)
Calcium: 9.8 mg/dL (ref 8.7–10.3)
Chloride: 103 mmol/L (ref 96–106)
Creatinine, Ser: 0.81 mg/dL (ref 0.57–1.00)
Globulin, Total: 2.8 g/dL (ref 1.5–4.5)
Glucose: 97 mg/dL (ref 70–99)
Potassium: 4.1 mmol/L (ref 3.5–5.2)
Sodium: 140 mmol/L (ref 134–144)
Total Protein: 7.2 g/dL (ref 6.0–8.5)
eGFR: 83 mL/min/{1.73_m2} (ref >59)

## 2023-07-12 LAB — LIPID PANEL
Chol/HDL Ratio: 5.3 {ratio} — ABNORMAL HIGH (ref 0.0–4.4)
Cholesterol, Total: 263 mg/dL — ABNORMAL HIGH (ref 100–199)
HDL: 50 mg/dL (ref >39)
LDL Chol Calc (NIH): 172 mg/dL — ABNORMAL HIGH (ref 0–99)
Triglycerides: 218 mg/dL — ABNORMAL HIGH (ref 0–149)
VLDL Cholesterol Cal: 41 mg/dL — ABNORMAL HIGH (ref 5–40)

## 2023-07-14 ENCOUNTER — Other Ambulatory Visit: Payer: Self-pay | Admitting: *Deleted

## 2023-07-14 ENCOUNTER — Telehealth: Payer: Self-pay | Admitting: *Deleted

## 2023-07-14 ENCOUNTER — Encounter: Payer: Self-pay | Admitting: *Deleted

## 2023-07-14 DIAGNOSIS — E782 Mixed hyperlipidemia: Secondary | ICD-10-CM

## 2023-07-14 NOTE — Telephone Encounter (Signed)
-----   Message from Tereso Newcomer sent at 07/14/2023  9:58 AM EST ----- Results sent to Mitzi Hansen via MyChart. See MyChart comments below. PLAN:  -Repeat fasting lipids, ALT in 3 mos  Ms. Davidow  Your LDL cholesterol is very high. I suspect this is due to being out of it for a while. Your kidney function (creatinine), potassium, liver enzymes (AST, ALT) are ok. I will recheck your cholesterol after you have been back on the Rosuvastatin for a while.  Tereso Newcomer, PA-C

## 2023-07-21 ENCOUNTER — Other Ambulatory Visit: Payer: Self-pay | Admitting: Physician Assistant

## 2023-07-29 ENCOUNTER — Other Ambulatory Visit: Payer: Self-pay

## 2023-07-29 ENCOUNTER — Emergency Department (HOSPITAL_COMMUNITY): Payer: 59

## 2023-07-29 ENCOUNTER — Encounter (HOSPITAL_COMMUNITY): Payer: Self-pay

## 2023-07-29 ENCOUNTER — Emergency Department (HOSPITAL_COMMUNITY)
Admission: EM | Admit: 2023-07-29 | Discharge: 2023-07-29 | Disposition: A | Discharge status: Home / Self Care | Payer: 59 | Attending: Emergency Medicine | Admitting: Emergency Medicine

## 2023-07-29 DIAGNOSIS — R0789 Other chest pain: Secondary | ICD-10-CM | POA: Diagnosis present

## 2023-07-29 DIAGNOSIS — S20219A Contusion of unspecified front wall of thorax, initial encounter: Secondary | ICD-10-CM | POA: Diagnosis not present

## 2023-07-29 DIAGNOSIS — S2020XA Contusion of thorax, unspecified, initial encounter: Secondary | ICD-10-CM

## 2023-07-29 DIAGNOSIS — I2510 Atherosclerotic heart disease of native coronary artery without angina pectoris: Secondary | ICD-10-CM | POA: Diagnosis not present

## 2023-07-29 DIAGNOSIS — Y92410 Unspecified street and highway as the place of occurrence of the external cause: Secondary | ICD-10-CM | POA: Insufficient documentation

## 2023-07-29 DIAGNOSIS — Z23 Encounter for immunization: Secondary | ICD-10-CM | POA: Insufficient documentation

## 2023-07-29 LAB — CBC WITH DIFFERENTIAL/PLATELET
Abs Immature Granulocytes: 0.08 10*3/uL — ABNORMAL HIGH (ref 0.00–0.07)
Basophils Absolute: 0.1 10*3/uL (ref 0.0–0.1)
Basophils Relative: 0 %
Eosinophils Absolute: 0.5 10*3/uL (ref 0.0–0.5)
Eosinophils Relative: 3 %
HCT: 42.9 % (ref 36.0–46.0)
Hemoglobin: 14.6 g/dL (ref 12.0–15.0)
Immature Granulocytes: 0 %
Lymphocytes Relative: 13 %
Lymphs Abs: 2.4 10*3/uL (ref 0.7–4.0)
MCH: 30.4 pg (ref 26.0–34.0)
MCHC: 34 g/dL (ref 30.0–36.0)
MCV: 89.2 fL (ref 80.0–100.0)
Monocytes Absolute: 1.1 10*3/uL — ABNORMAL HIGH (ref 0.1–1.0)
Monocytes Relative: 6 %
Neutro Abs: 13.9 10*3/uL — ABNORMAL HIGH (ref 1.7–7.7)
Neutrophils Relative %: 78 %
Platelets: 319 10*3/uL (ref 150–400)
RBC: 4.81 MIL/uL (ref 3.87–5.11)
RDW: 13.3 % (ref 11.5–15.5)
WBC: 18.2 10*3/uL — ABNORMAL HIGH (ref 4.0–10.5)
nRBC: 0 % (ref 0.0–0.2)

## 2023-07-29 MED ORDER — ACETAMINOPHEN 325 MG PO TABS
650.0000 mg | ORAL_TABLET | Freq: Once | ORAL | Status: AC
  Administered 2023-07-29: 650 mg via ORAL
  Filled 2023-07-29: qty 2

## 2023-07-29 MED ORDER — IOHEXOL 350 MG/ML SOLN
75.0000 mL | Freq: Once | INTRAVENOUS | Status: AC | PRN
  Administered 2023-07-29: 75 mL via INTRAVENOUS

## 2023-07-29 MED ORDER — CYCLOBENZAPRINE HCL 10 MG PO TABS: 10.0000 mg | ORAL_TABLET | Freq: Two times a day (BID) | ORAL | 0 refills | Status: DC | PRN

## 2023-07-29 MED ORDER — LIDOCAINE 5 % EX PTCH: 1.0000 | MEDICATED_PATCH | CUTANEOUS | 0 refills | Status: DC

## 2023-07-29 MED ORDER — TETANUS-DIPHTH-ACELL PERTUSSIS 5-2.5-18.5 LF-MCG/0.5 IM SUSY
0.5000 mL | PREFILLED_SYRINGE | Freq: Once | INTRAMUSCULAR | Status: AC
  Administered 2023-07-29: 0.5 mL via INTRAMUSCULAR
  Filled 2023-07-29: qty 0.5

## 2023-07-29 NOTE — ED Notes (Signed)
 Discharge instructions reviewed with patient. Patient questions answered and opportunity for education reviewed. Patient voices understanding of discharge instructions with no further questions. Patient ambulatory with steady gait to lobby.
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
## 2023-07-29 NOTE — Discharge Instructions (Addendum)
 Continue Tylenol  1000 mg every 6 hours as needed for pain.  I have written you for a muscle relaxant called Flexeril  to take as needed as well.  This medication is sedating so do not mix with alcohol or drugs or dangerous activities including driving.  I prescribed lidocaine  patches to use as well.
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
## 2023-08-10 ENCOUNTER — Other Ambulatory Visit: Payer: Self-pay | Admitting: Physician Assistant

## 2023-08-10 ENCOUNTER — Other Ambulatory Visit: Payer: Self-pay | Admitting: Cardiovascular Disease

## 2023-10-06 ENCOUNTER — Other Ambulatory Visit: Payer: Self-pay | Admitting: Physician Assistant

## 2024-04-11 ENCOUNTER — Other Ambulatory Visit: Payer: Self-pay | Admitting: Physician Assistant

## 2024-07-03 ENCOUNTER — Other Ambulatory Visit: Payer: Self-pay | Admitting: Physician Assistant

## 2024-07-05 ENCOUNTER — Other Ambulatory Visit: Payer: Self-pay | Admitting: Physician Assistant

## 2024-07-12 ENCOUNTER — Ambulatory Visit: Admitting: Physician Assistant
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
## 2024-07-12 NOTE — Assessment & Plan Note (Signed)
 Status post anteroseptal STEMI in April 2021, treated with DES to mid LAD. Cardiac catheterization showed 50% ostial and mid LCx stenosis and 100% mid RCA chronic total occlusion with left to right collaterals. Initial EF was 30-35% but improved to 60-65% by July 2021. She is doing well w/o chest pain to suggest angina. She has been on Brilinta  for years. She is ~5 years out from her event. I think it is reasonable to transition to ASA as platelet monotherapy now. She is agreeable. - Finish Brilinta  and stop; then start ASA 81 mg once daily - Continue Metoprolol  succinate 12.5 mg once daily, Rosuvastatin  40 mg once daily, NTG prn - Follow up 1 year.

## 2024-07-13 ENCOUNTER — Encounter: Payer: Self-pay | Admitting: Physician Assistant

## 2024-07-13 ENCOUNTER — Ambulatory Visit: Attending: Physician Assistant | Admitting: Physician Assistant

## 2024-07-13 VITALS — BP 110/68 | HR 77 | Resp 16 | Ht 62.0 in | Wt 213.2 lb

## 2024-07-13 DIAGNOSIS — I5020 Unspecified systolic (congestive) heart failure: Secondary | ICD-10-CM

## 2024-07-13 DIAGNOSIS — E782 Mixed hyperlipidemia: Secondary | ICD-10-CM

## 2024-07-13 DIAGNOSIS — I2510 Atherosclerotic heart disease of native coronary artery without angina pectoris: Secondary | ICD-10-CM

## 2024-07-13 LAB — LIPID PANEL
Chol/HDL Ratio: 2.3 ratio (ref 0.0–4.4)
Cholesterol, Total: 132 mg/dL (ref 100–199)
HDL: 57 mg/dL (ref >39)
LDL Chol Calc (NIH): 59 mg/dL (ref 0–99)
Triglycerides: 81 mg/dL (ref 0–149)
VLDL Cholesterol Cal: 16 mg/dL (ref 5–40)

## 2024-07-13 LAB — COMPREHENSIVE METABOLIC PANEL WITH GFR
ALT: 24 IU/L (ref 0–32)
AST: 21 IU/L (ref 0–40)
Albumin: 4.1 g/dL (ref 3.9–4.9)
Alkaline Phosphatase: 87 IU/L (ref 49–135)
BUN/Creatinine Ratio: 15 (ref 12–28)
BUN: 11 mg/dL (ref 8–27)
Bilirubin Total: 0.3 mg/dL (ref 0.0–1.2)
CO2: 21 mmol/L (ref 20–29)
Calcium: 9.5 mg/dL (ref 8.7–10.3)
Chloride: 108 mmol/L — ABNORMAL HIGH (ref 96–106)
Creatinine, Ser: 0.72 mg/dL (ref 0.57–1.00)
Globulin, Total: 2.1 g/dL (ref 1.5–4.5)
Glucose: 93 mg/dL (ref 70–99)
Potassium: 4.4 mmol/L (ref 3.5–5.2)
Sodium: 145 mmol/L — ABNORMAL HIGH (ref 134–144)
Total Protein: 6.2 g/dL (ref 6.0–8.5)
eGFR: 94 mL/min/1.73 (ref >59)

## 2024-07-13 MED ORDER — ASPIRIN 81 MG PO TBEC: 81.0000 mg | DELAYED_RELEASE_TABLET | Freq: Every day | ORAL | Status: AC

## 2024-07-13 NOTE — Assessment & Plan Note (Addendum)
 Goal LDL < 55. Continue Rosuvastatin  40 mg once daily, Ezetimibe  10 mg once daily. - Fasting Lipids, CMET today.

## 2024-07-13 NOTE — Patient Instructions (Signed)
 Medication Instructions:  Start over the counter aspirin  81 mg daily once you finish the supply of brilinta  that you have on hand. *If you need a refill on your cardiac medications before your next appointment, please call your pharmacy*  Lab Work: CMET, [facility] If you have labs (blood work) drawn today and your tests are completely normal, you will receive your results only by: MyChart Message (if you have MyChart) OR A paper copy in the mail If you have any lab test that is abnormal or we need to change your treatment, we will call you to review the results.  Follow-Up: At [facility], you and your health needs are our priority.  As part of our continuing mission to provide you with exceptional heart care, our providers are all part of one team.  This team includes your primary Cardiologist (physician) and Advanced Practice Providers or APPs (Physician Assistants and Nurse Practitioners) who all work together to provide you with the care you need, when you need it.  Your next appointment:   1 year(s)  Provider:   Glendia Ferrier, PA-C

## 2024-07-13 NOTE — Assessment & Plan Note (Addendum)
 Ischemic cardiomyopathy.  EF was 30-35 and improved to normal.  NYHA II.  No evidence of volume excess.   - Continue Furosemide  20 mg once daily, Losartan  25 mg once daily, Metoprolol  succinate 12.5 mg once daily.  - CMET today - Follow up 1 year.

## 2024-07-14 ENCOUNTER — Ambulatory Visit: Payer: Self-pay | Admitting: Physician Assistant

## 2024-07-25 ENCOUNTER — Other Ambulatory Visit: Payer: Self-pay | Admitting: Physician Assistant

## 2024-07-27 ENCOUNTER — Telehealth: Payer: Self-pay

## 2024-07-27 NOTE — Telephone Encounter (Signed)
 Called and spoke with patient- patient has requested to call the office back when she is able to schedule her recall colonoscopy;

## 2024-07-31 ENCOUNTER — Other Ambulatory Visit: Payer: Self-pay | Admitting: Physician Assistant

## 2024-08-05 ENCOUNTER — Other Ambulatory Visit: Payer: Self-pay | Admitting: Physician Assistant

## 2024-08-05 ENCOUNTER — Other Ambulatory Visit: Payer: Self-pay | Admitting: Cardiovascular Disease
